# Patient Record
Sex: Female | Born: 1990 | Race: White | Hispanic: No | Marital: Single | Smoking: Never smoker
Health system: Southern US, Community
[De-identification: ages and names within clinical notes are randomized; demographics above are authoritative.]

## PROBLEM LIST (undated history)

## (undated) DIAGNOSIS — E611 Iron deficiency: Secondary | ICD-10-CM

## (undated) DIAGNOSIS — E039 Hypothyroidism, unspecified: Secondary | ICD-10-CM

## (undated) DIAGNOSIS — R000 Tachycardia, unspecified: Secondary | ICD-10-CM

## (undated) DIAGNOSIS — G901 Familial dysautonomia [Riley-Day]: Secondary | ICD-10-CM

## (undated) DIAGNOSIS — E079 Disorder of thyroid, unspecified: Secondary | ICD-10-CM

## (undated) DIAGNOSIS — E2740 Unspecified adrenocortical insufficiency: Secondary | ICD-10-CM

## (undated) DIAGNOSIS — F909 Attention-deficit hyperactivity disorder, unspecified type: Secondary | ICD-10-CM

## (undated) HISTORY — DX: Unspecified adrenocortical insufficiency: E27.40

## (undated) HISTORY — PX: OTHER SURGICAL HISTORY: SHX169

## (undated) HISTORY — PX: SPLENECTOMY: SUR1306

## (undated) HISTORY — PX: TONSILLECTOMY: SUR1361

---
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
## 2011-01-04 ENCOUNTER — Emergency Department: Payer: Self-pay | Admitting: Emergency Medicine

## 2011-01-20 ENCOUNTER — Emergency Department (HOSPITAL_COMMUNITY)
Admission: EM | Admit: 2011-01-20 | Discharge: 2011-01-21 | Disposition: A | Discharge status: Home / Self Care | Payer: Managed Care, Other (non HMO) | Attending: Emergency Medicine | Admitting: Emergency Medicine

## 2011-01-20 DIAGNOSIS — R220 Localized swelling, mass and lump, head: Secondary | ICD-10-CM | POA: Insufficient documentation

## 2011-01-20 DIAGNOSIS — R000 Tachycardia, unspecified: Secondary | ICD-10-CM | POA: Insufficient documentation

## 2011-01-20 DIAGNOSIS — R221 Localized swelling, mass and lump, neck: Secondary | ICD-10-CM | POA: Insufficient documentation

## 2011-01-20 DIAGNOSIS — F988 Other specified behavioral and emotional disorders with onset usually occurring in childhood and adolescence: Secondary | ICD-10-CM | POA: Insufficient documentation

## 2011-01-20 DIAGNOSIS — F411 Generalized anxiety disorder: Secondary | ICD-10-CM | POA: Insufficient documentation

## 2011-01-20 DIAGNOSIS — R4701 Aphasia: Secondary | ICD-10-CM | POA: Insufficient documentation

## 2011-01-20 DIAGNOSIS — I10 Essential (primary) hypertension: Secondary | ICD-10-CM | POA: Insufficient documentation

## 2011-01-20 DIAGNOSIS — T7840XA Allergy, unspecified, initial encounter: Secondary | ICD-10-CM | POA: Insufficient documentation

## 2011-01-20 DIAGNOSIS — L298 Other pruritus: Secondary | ICD-10-CM | POA: Insufficient documentation

## 2011-01-20 DIAGNOSIS — E039 Hypothyroidism, unspecified: Secondary | ICD-10-CM | POA: Insufficient documentation

## 2011-01-20 DIAGNOSIS — R061 Stridor: Secondary | ICD-10-CM | POA: Insufficient documentation

## 2011-01-20 DIAGNOSIS — J45909 Unspecified asthma, uncomplicated: Secondary | ICD-10-CM | POA: Insufficient documentation

## 2011-01-20 DIAGNOSIS — L2989 Other pruritus: Secondary | ICD-10-CM | POA: Insufficient documentation

## 2011-01-27 ENCOUNTER — Emergency Department: Payer: Self-pay | Admitting: Emergency Medicine

## 2011-02-06 ENCOUNTER — Emergency Department: Payer: Self-pay | Admitting: Emergency Medicine

## 2011-02-20 ENCOUNTER — Emergency Department (HOSPITAL_COMMUNITY)
Admission: EM | Admit: 2011-02-20 | Discharge: 2011-02-20 | Disposition: A | Discharge status: Home / Self Care | Payer: Managed Care, Other (non HMO) | Attending: Emergency Medicine | Admitting: Emergency Medicine

## 2011-02-20 DIAGNOSIS — E039 Hypothyroidism, unspecified: Secondary | ICD-10-CM | POA: Insufficient documentation

## 2011-02-20 DIAGNOSIS — F411 Generalized anxiety disorder: Secondary | ICD-10-CM | POA: Insufficient documentation

## 2011-02-20 DIAGNOSIS — X58XXXA Exposure to other specified factors, initial encounter: Secondary | ICD-10-CM | POA: Insufficient documentation

## 2011-02-20 DIAGNOSIS — T7840XA Allergy, unspecified, initial encounter: Secondary | ICD-10-CM | POA: Insufficient documentation

## 2011-02-20 DIAGNOSIS — Z794 Long term (current) use of insulin: Secondary | ICD-10-CM | POA: Insufficient documentation

## 2011-02-20 DIAGNOSIS — E109 Type 1 diabetes mellitus without complications: Secondary | ICD-10-CM | POA: Insufficient documentation

## 2011-03-06 ENCOUNTER — Emergency Department (HOSPITAL_COMMUNITY): Payer: Managed Care, Other (non HMO)

## 2011-03-06 ENCOUNTER — Emergency Department (HOSPITAL_COMMUNITY)
Admission: EM | Admit: 2011-03-06 | Discharge: 2011-03-07 | Disposition: A | Discharge status: Home / Self Care | Payer: Managed Care, Other (non HMO) | Attending: Emergency Medicine | Admitting: Emergency Medicine

## 2011-03-06 DIAGNOSIS — F411 Generalized anxiety disorder: Secondary | ICD-10-CM | POA: Insufficient documentation

## 2011-03-06 DIAGNOSIS — R0609 Other forms of dyspnea: Secondary | ICD-10-CM | POA: Insufficient documentation

## 2011-03-06 DIAGNOSIS — R0989 Other specified symptoms and signs involving the circulatory and respiratory systems: Secondary | ICD-10-CM | POA: Insufficient documentation

## 2011-03-06 LAB — POCT I-STAT, CHEM 8
Creatinine, Ser: 0.5 mg/dL (ref 0.50–1.10)
HCT: 41 % (ref 36.0–46.0)
Hemoglobin: 13.9 g/dL (ref 12.0–15.0)
Potassium: 4.5 mEq/L (ref 3.5–5.1)
Sodium: 138 mEq/L (ref 135–145)

## 2011-03-07 ENCOUNTER — Emergency Department (HOSPITAL_COMMUNITY)
Admit: 2011-03-07 | Discharge: 2011-03-07 | Disposition: A | Discharge status: Home / Self Care | Payer: Managed Care, Other (non HMO) | Attending: Emergency Medicine | Admitting: Emergency Medicine

## 2011-03-07 LAB — DIFFERENTIAL
Basophils Absolute: 0 10*3/uL (ref 0.0–0.1)
Lymphocytes Relative: 4 % — ABNORMAL LOW (ref 12–46)
Neutro Abs: 12.7 10*3/uL — ABNORMAL HIGH (ref 1.7–7.7)
Neutrophils Relative %: 96 % — ABNORMAL HIGH (ref 43–77)

## 2011-03-07 LAB — POCT I-STAT 3, ART BLOOD GAS (G3+)
O2 Saturation: 97 %
TCO2: 23 mmol/L (ref 0–100)
pCO2 arterial: 33.7 mmHg — ABNORMAL LOW (ref 35.0–45.0)
pH, Arterial: 7.42 — ABNORMAL HIGH (ref 7.350–7.400)
pO2, Arterial: 93 mmHg (ref 80.0–100.0)

## 2011-03-07 LAB — CBC
HCT: 37.6 % (ref 36.0–46.0)
Hemoglobin: 13.2 g/dL (ref 12.0–15.0)
WBC: 13.3 10*3/uL — ABNORMAL HIGH (ref 4.0–10.5)

## 2011-03-09 LAB — GLUCOSE, CAPILLARY: Glucose-Capillary: 151 mg/dL — ABNORMAL HIGH (ref 70–99)

## 2011-05-26 ENCOUNTER — Emergency Department: Payer: Self-pay | Admitting: Emergency Medicine

## 2011-07-22 ENCOUNTER — Emergency Department: Payer: Self-pay | Admitting: *Deleted

## 2011-07-28 ENCOUNTER — Emergency Department (HOSPITAL_COMMUNITY)
Admission: EM | Admit: 2011-07-28 | Discharge: 2011-07-28 | Disposition: A | Discharge status: Home / Self Care | Payer: Managed Care, Other (non HMO) | Attending: Emergency Medicine | Admitting: Emergency Medicine

## 2011-07-28 ENCOUNTER — Encounter: Payer: Self-pay | Admitting: *Deleted

## 2011-07-28 DIAGNOSIS — Z79899 Other long term (current) drug therapy: Secondary | ICD-10-CM | POA: Insufficient documentation

## 2011-07-28 DIAGNOSIS — E079 Disorder of thyroid, unspecified: Secondary | ICD-10-CM | POA: Insufficient documentation

## 2011-07-28 DIAGNOSIS — J45909 Unspecified asthma, uncomplicated: Secondary | ICD-10-CM | POA: Insufficient documentation

## 2011-07-28 DIAGNOSIS — R1110 Vomiting, unspecified: Secondary | ICD-10-CM | POA: Insufficient documentation

## 2011-07-28 DIAGNOSIS — E1169 Type 2 diabetes mellitus with other specified complication: Secondary | ICD-10-CM | POA: Insufficient documentation

## 2011-07-28 DIAGNOSIS — E162 Hypoglycemia, unspecified: Secondary | ICD-10-CM

## 2011-07-28 DIAGNOSIS — F909 Attention-deficit hyperactivity disorder, unspecified type: Secondary | ICD-10-CM | POA: Insufficient documentation

## 2011-07-28 HISTORY — DX: Attention-deficit hyperactivity disorder, unspecified type: F90.9

## 2011-07-28 HISTORY — DX: Disorder of thyroid, unspecified: E07.9

## 2011-07-28 LAB — GLUCOSE, CAPILLARY
Glucose-Capillary: 159 mg/dL — ABNORMAL HIGH (ref 70–99)
Glucose-Capillary: 53 mg/dL — ABNORMAL LOW (ref 70–99)

## 2011-07-28 MED ORDER — ONDANSETRON HCL 4 MG PO TABS: 4.0000 mg | ORAL_TABLET | Freq: Four times a day (QID) | ORAL | Status: AC

## 2011-07-28 MED ORDER — GLUCOSE 40 % PO GEL
ORAL | Status: AC
  Filled 2011-07-28: qty 1

## 2011-07-28 MED ORDER — DEXTROSE 50 % IV SOLN
INTRAVENOUS | Status: AC
  Administered 2011-07-28: 25 g via INTRAVENOUS
  Filled 2011-07-28: qty 50

## 2011-07-28 MED ORDER — PREDNISONE 20 MG PO TABS
20.0000 mg | ORAL_TABLET | Freq: Once | ORAL | Status: AC
  Administered 2011-07-28: 20 mg via ORAL
  Filled 2011-07-28: qty 1

## 2011-07-28 MED ORDER — ONDANSETRON HCL 4 MG/2ML IJ SOLN
4.0000 mg | Freq: Once | INTRAMUSCULAR | Status: AC
  Administered 2011-07-28: 4 mg via INTRAVENOUS
  Filled 2011-07-28: qty 2

## 2011-07-28 NOTE — ED Notes (Signed)
BBG 53 after oral glucose

## 2011-07-28 NOTE — ED Provider Notes (Signed)
History     CSN: [id number] Arrival date & time: 07/28/2011 11:06 AM   First MD Initiated Contact with Patient 07/28/11 1152      Chief Complaint  Patient presents with  . 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 She has been insulin-dependent for a few months. She states today about 9 AM she took her prednisone 20 mg and also took her NPH insulin however she immediately vomited the pill. She states about 30 minutes prior to arrival she checked her blood sugar was 32 and she took a dose of glucagon and came to the ER. She states that she's been here she's eaten a [place] sandwich. She relates her nausea is better and she would like to try taking her prednisone again.   Primary care physician at [facility]   Past Medical History  Diagnosis Date  . Diabetes mellitus   . Asthma   . Thyroid disease   . ADHD (attention deficit hyperactivity disorder)     History reviewed. No pertinent past surgical history.  History reviewed. No pertinent family history.  History  Substance Use Topics  . Smoking status: Never Smoker   . Smokeless tobacco: Not on file  . Alcohol Use: Yes   college student  OB History    Grav Para Term Preterm Abortions TAB SAB Ect Mult Living                  Review of Systems  All other systems reviewed and are negative.    Allergies  Advil; Amoxicillin; Azithromycin; Barium-containing compounds; Bee; Fish allergy; Gelatin; Iodine; Latex; Oxycodone; Peanut-containing drug products; and Penicillins  Home Medications   Current Outpatient Rx  Name Route Sig Dispense Refill  . DIPHENHYDRAMINE HCL 50 MG PO CAPS Oral Take 50 mg by mouth 2 (two) times daily.      Marland Kitchen FAMOTIDINE 20 MG PO TABS Oral Take 20 mg by mouth 2 (two) times daily.      Marland Kitchen FEXOFENADINE  HCL 30 MG PO TABS Oral Take 30 mg by mouth 2 (two) times daily.      Marland Kitchen FLUTICASONE-SALMETEROL 250-50 MCG/DOSE IN AEPB Inhalation Inhale 1 puff into the lungs every 12 (twelve) hours.      Marland Kitchen GLUCAGON (RDNA) 1 MG IJ KIT Intravenous Inject 1 mg into the vein once as needed. Low blood sugar     . HYDROCORTISONE SOD SUCCINATE 100 MG IJ SOLR Intravenous Inject 100 mg into the vein as needed. For adrenal crisis      . INSULIN LISPRO (HUMAN) 100 UNIT/ML [place] SOLN Subcutaneous Inject 1-10 Units into the skin 3 (three) times daily before meals. Sliding scale     . INSULIN ISOPHANE HUMAN 100 UNIT/ML [place] SUSP Subcutaneous Inject 15 Units into the skin 2 (two) times daily.      Marland Kitchen LEVOTHYROXINE SODIUM 125 MCG PO TABS Oral Take 125 mcg by mouth daily.      Marland Kitchen LORATADINE 10 MG PO TABS Oral Take 10 mg by mouth daily.      . METHYLPHENIDATE 30 MG/9HR TD PTCH Transdermal Place 1 patch onto the skin daily. wear patch for 9 hours only each day     . METHYLPHENIDATE HCL 10 MG PO TABS Oral Take 10 mg by mouth every evening.      Marland Kitchen MONTELUKAST SODIUM 10 MG PO TABS Oral  Take 10 mg by mouth at bedtime.      . OLOPATADINE HCL 0.1 % OP SOLN Both Eyes Place 1 drop into both eyes 2 (two) times daily.      Marland Kitchen PIRBUTEROL ACETATE 200 MCG/INH IN AERB Inhalation Inhale 2 puffs into the lungs 4 (four) times daily as needed. Uses when she horseback rides.     Marland Kitchen PREDNISONE 20 MG PO TABS Oral Take 20 mg by mouth 2 (two) times daily.      Marland Kitchen EPINEPHRINE 0.15 MG/0.3ML IJ DEVI Intramuscular Inject 0.15 mg into the muscle as needed.      Marland Kitchen ONDANSETRON HCL 4 MG PO TABS Oral Take 1 tablet (4 mg total) by mouth every 6 (six) hours. 6 tablet 0    BP 122/91  Pulse 116  Temp(Src) 98.5 F (36.9 C) (Oral)  Resp 18  SpO2 98%  No signs tachycardia otherwise normal  Physical Exam  Nursing note and vitals reviewed. Constitutional: She is oriented to person, place, and time. She appears well-developed and well-nourished.  Non-toxic appearance. She  does not appear ill. No distress.       Cooperative smiling  HENT:  Head: Normocephalic and atraumatic.  Right Ear: External ear normal.  Left Ear: External ear normal.  Nose: Nose normal. No mucosal edema or rhinorrhea.  Mouth/Throat: Oropharynx is clear and moist and mucous membranes are normal. No dental abscesses or uvula swelling.  Eyes: Conjunctivae and EOM are normal. Pupils are equal, round, and reactive to light.  Neck: Normal range of motion and full passive range of motion without pain. Neck supple.  Cardiovascular: Normal rate, regular rhythm and normal heart sounds.  Exam reveals no gallop and no friction rub.   No murmur heard. Pulmonary/Chest: Effort normal and breath sounds normal. No respiratory distress. She has no wheezes. She has no rhonchi. She has no rales. She exhibits no tenderness and no crepitus.  Abdominal: Soft. Normal appearance and bowel sounds are normal. She exhibits no distension. There is no tenderness. There is no rebound and no guarding.  Musculoskeletal: Normal range of motion. She exhibits no edema and no tenderness.       Moves all extremities well.   Neurological: She is alert and oriented to person, place, and time. She has normal strength. No cranial nerve deficit.  Skin: Skin is warm, dry and intact. No rash noted. No erythema. No pallor.  Psychiatric: She has a normal mood and affect. Her speech is normal and behavior is normal. Her mood appears not anxious.    ED Course  Procedures (including critical care time)  Patient given Zofran or milligrams IV and prednisone 20 mg orally. Recheck at 1:30 shows patient alert and active talking to her friend. She states she's rate ago. We will check one more blood sugar before she leaves. Pt wants to leave, states she will eat when she leaves.   Results for orders placed during the hospital encounter of 07/28/11  GLUCOSE, CAPILLARY      Component Value Range   Glucose-Capillary 39 (*) 70 - 99 (mg/dL)    Comment 1 Notify RN    GLUCOSE, CAPILLARY      Component Value Range   Glucose-Capillary 53 (*) 70 - 99 (mg/dL)  GLUCOSE, CAPILLARY      Component Value Range   Glucose-Capillary 159 (*) 70 - 99 (mg/dL)  GLUCOSE, CAPILLARY      Component Value Range   Glucose-Capillary 96  70 - 99 (mg/dL)    Diagnoses  that have been ruled out:  Diagnoses that are still under consideration:  Final diagnoses:  Hypoglycemia  Vomiting    Plan discharge  Devoria Albe, MD, FACEP     MDM          Ward Givens, MD 07/28/11 [phone]

## 2011-07-28 NOTE — ED Notes (Signed)
BBG 159 after dextrose 50% 1 amp @ 1226

## 2011-07-28 NOTE — ED Notes (Signed)
Pt reports low blood sugar this am, states type one diabetic. States sugar was 33. Took glucagon, repeated sugar and was wnl. Pt reports she is afraid it will go back down. Takes prednisone daily.

## 2011-07-28 NOTE — ED Notes (Signed)
Patient given [place] sandwich, cheese and milk per orders from Ebbie Ridge, PA-C

## 2011-08-31 ENCOUNTER — Encounter (HOSPITAL_COMMUNITY): Payer: Self-pay | Admitting: *Deleted

## 2011-08-31 ENCOUNTER — Emergency Department (HOSPITAL_COMMUNITY)
Admission: EM | Admit: 2011-08-31 | Discharge: 2011-09-01 | Discharge status: Against Medical Advice | Payer: Managed Care, Other (non HMO) | Attending: Emergency Medicine | Admitting: Emergency Medicine

## 2011-08-31 DIAGNOSIS — F411 Generalized anxiety disorder: Secondary | ICD-10-CM | POA: Insufficient documentation

## 2011-08-31 DIAGNOSIS — E162 Hypoglycemia, unspecified: Secondary | ICD-10-CM

## 2011-08-31 DIAGNOSIS — E1069 Type 1 diabetes mellitus with other specified complication: Secondary | ICD-10-CM | POA: Insufficient documentation

## 2011-08-31 DIAGNOSIS — R000 Tachycardia, unspecified: Secondary | ICD-10-CM | POA: Insufficient documentation

## 2011-08-31 DIAGNOSIS — Z79899 Other long term (current) drug therapy: Secondary | ICD-10-CM | POA: Insufficient documentation

## 2011-08-31 DIAGNOSIS — Z794 Long term (current) use of insulin: Secondary | ICD-10-CM | POA: Insufficient documentation

## 2011-08-31 DIAGNOSIS — J45909 Unspecified asthma, uncomplicated: Secondary | ICD-10-CM | POA: Insufficient documentation

## 2011-08-31 DIAGNOSIS — F909 Attention-deficit hyperactivity disorder, unspecified type: Secondary | ICD-10-CM | POA: Insufficient documentation

## 2011-08-31 DIAGNOSIS — R259 Unspecified abnormal involuntary movements: Secondary | ICD-10-CM | POA: Insufficient documentation

## 2011-08-31 LAB — GLUCOSE, CAPILLARY: Glucose-Capillary: 117 mg/dL — ABNORMAL HIGH (ref 70–99)

## 2011-08-31 MED ORDER — GLUCOSE 40 % PO GEL
1.0000 | Freq: Once | ORAL | Status: AC
  Administered 2011-08-31: 37.5 g via ORAL
  Filled 2011-08-31: qty 1

## 2011-08-31 NOTE — ED Notes (Signed)
PT reported  Receiving extra insulin at a new MD. Pt reports CBG of 34  At home . Pt was instructed  By PCP to take 150 mg solucortef and glucagon 2mg .

## 2011-08-31 NOTE — ED Provider Notes (Signed)
History     CSN: [id number]  Arrival date & time 08/31/11  2015   First MD Initiated Contact with Patient 08/31/11 2202      Chief Complaint  Patient presents with  . Hypoglycemia     HPI  History provided by the patient. Patient is a 21 year old female with history of type 1 diabetes presents with complaints of hypoglycemia earlier today. Patient states that she is seeing a new adult endocrinologist who prescribed her 50 units of Lantus to take. She reports having her first dose earlier today and about one hour prior to arrival she began feeling shaky and having symptoms of low blood sugar. When she checked her sugar she states that was 34. She took tabs of glucagon and 150 mg of Solucortef. Patient states that since that time she is starting to feel much better and was able to drive her to the emergency room. Patient continues to feel well at this time. She has no complaints but states that she is frustrated her new doctor put her on such a high dose of Lantus. Patient states that she is very sensitive to insulin as and she knew prior to this that was likely going to be too much. Patient has no other symptoms.     Past Medical History  Diagnosis Date  . Diabetes mellitus   . Asthma   . Thyroid disease   . ADHD (attention deficit hyperactivity disorder)     No past surgical history on file.  No family history on file.  History  Substance Use Topics  . Smoking status: Never Smoker   . Smokeless tobacco: Not on file  . Alcohol Use: Yes    OB History    Grav Para Term Preterm Abortions TAB SAB Ect Mult Living                  Review of Systems  All other systems reviewed and are negative.    Allergies  Advil; Amoxicillin; Azithromycin; Barium-containing compounds; Bee; Fish allergy; Gelatin; Iodine; Latex; Other; Oxycodone; Peanut-containing drug products; Penicillins; and Shellfish allergy  Home Medications   Current Outpatient Rx  Name Route Sig Dispense Refill   . DIPHENHYDRAMINE HCL 50 MG PO CAPS Oral Take 50 mg by mouth 2 (two) times daily.      Marland Kitchen EPINEPHRINE 0.15 MG/0.3ML IJ DEVI Intramuscular Inject 0.15 mg into the muscle as needed.      Marland Kitchen FAMOTIDINE 20 MG PO TABS Oral Take 20 mg by mouth 2 (two) times daily.      Marland Kitchen FEXOFENADINE HCL 30 MG PO TABS Oral Take 30 mg by mouth 2 (two) times daily.      Marland Kitchen FLUTICASONE-SALMETEROL 250-50 MCG/DOSE IN AEPB Inhalation Inhale 1 puff into the lungs every 12 (twelve) hours.      Marland Kitchen GLUCAGON (RDNA) 1 MG IJ KIT Intravenous Inject 1 mg into the vein once as needed. Low blood sugar     . HYDROCORTISONE SOD SUCCINATE 100 MG IJ SOLR Intravenous Inject 100 mg into the vein as needed. For adrenal crisis      . INSULIN LISPRO (HUMAN) 100 UNIT/ML [place] SOLN Subcutaneous Inject 1-10 Units into the skin 3 (three) times daily before meals. Sliding scale     . INSULIN ISOPHANE HUMAN 100 UNIT/ML [place] SUSP Subcutaneous Inject 15 Units into the skin 2 (two) times daily.      Marland Kitchen LEVOTHYROXINE SODIUM 125 MCG PO TABS Oral Take 125 mcg by mouth daily.      Marland Kitchen  LORATADINE 10 MG PO TABS Oral Take 10 mg by mouth daily.      . METHYLPHENIDATE 30 MG/9HR TD PTCH Transdermal Place 1 patch onto the skin daily. wear patch for 9 hours only each day     . METHYLPHENIDATE HCL 10 MG PO TABS Oral Take 10 mg by mouth every evening.      Marland Kitchen MONTELUKAST SODIUM 10 MG PO TABS Oral Take 10 mg by mouth at bedtime.      . OLOPATADINE HCL 0.1 % OP SOLN Both Eyes Place 1 drop into both eyes 2 (two) times daily.      Marland Kitchen PIRBUTEROL ACETATE 200 MCG/INH IN AERB Inhalation Inhale 2 puffs into the lungs 4 (four) times daily as needed. Uses when she horseback rides.     Marland Kitchen PREDNISONE 20 MG PO TABS Oral Take 20 mg by mouth 2 (two) times daily.        BP 119/79  Pulse 105  Temp(Src) 98.5 F (36.9 C) (Oral)  Resp 20  SpO2 99%  Physical Exam  Nursing note and vitals reviewed. Constitutional: She is oriented to person, place, and time. She appears well-developed and  well-nourished. No distress.  HENT:  Head: Normocephalic and atraumatic.  Eyes: Conjunctivae and EOM are normal. Pupils are equal, round, and reactive to light.  Cardiovascular: Regular rhythm.  Tachycardia present.   Pulmonary/Chest: Effort normal and breath sounds normal.  Abdominal: Soft.  Neurological: She is alert and oriented to person, place, and time. She has normal strength. No sensory deficit. Gait normal.  Skin: Skin is warm and dry. No rash noted.  Psychiatric: Her behavior is normal. Her mood appears anxious.    ED Course  Procedures   Results for orders placed during the hospital encounter of 08/31/11  GLUCOSE, CAPILLARY      Component Value Range   Glucose-Capillary 117 (*) 70 - 99 (mg/dL)   Comment 1 Notify RN    GLUCOSE, CAPILLARY      Component Value Range   Glucose-Capillary 104 (*) 70 - 99 (mg/dL)   Comment 1 Notify RN    GLUCOSE, CAPILLARY      Component Value Range   Glucose-Capillary 77  70 - 99 (mg/dL)   Comment 1 Notify RN    GLUCOSE, CAPILLARY      Component Value Range   Glucose-Capillary 93  70 - 99 (mg/dL)   Comment 1 Notify RN    GLUCOSE, CAPILLARY      Component Value Range   Glucose-Capillary 55 (*) 70 - 99 (mg/dL)  GLUCOSE, CAPILLARY      Component Value Range   Glucose-Capillary 148 (*) 70 - 99 (mg/dL)   Comment 1 Notify RN    GLUCOSE, CAPILLARY      Component Value Range   Glucose-Capillary 98  70 - 99 (mg/dL)   Comment 1 Documented in Chart     Comment 2 Notify RN    GLUCOSE, CAPILLARY      Component Value Range   Glucose-Capillary 65 (*) 70 - 99 (mg/dL)   Comment 1 Notify RN     Comment 2 Documented in Chart    GLUCOSE, CAPILLARY      Component Value Range   Glucose-Capillary 171 (*) 70 - 99 (mg/dL)       1. Hypoglycemia       MDM  10:10 PM patient seen and evaluated. Patient in no acute distress. Patient here for hypoglycemia after taking Lantus. Plan to check serial CBCs over the next  hour to trend CBG. Patient  will be kept n.p.o. except for water.   She continues to have dropping blood sugar despite intravenous D5 and patient eating.  Patient discussed with attending physician. Plan to keep patient until blood sugar stabilizes any other trends up slightly or maintains a good level.   Patient now wishing to return home. She states she no longer wants to stay in the emergency room. I explained the risks of her sugar dropping low if she returns home. She states that she understands and will check her sugar closely and continue to eat food and candy. Patient understands she will be leaving AMA. She was advised she may return at any time and the seriousness of her condition.     Angus Seller, [place] 09/01/11 [phone]

## 2011-09-01 LAB — GLUCOSE, CAPILLARY
Glucose-Capillary: 55 mg/dL — ABNORMAL LOW (ref 70–99)
Glucose-Capillary: 148 mg/dL — ABNORMAL HIGH (ref 70–99)

## 2011-09-01 MED ORDER — ONDANSETRON 4 MG PO TBDP
8.0000 mg | ORAL_TABLET | Freq: Once | ORAL | Status: AC
  Administered 2011-09-01: 8 mg via ORAL

## 2011-09-01 MED ORDER — ONDANSETRON 4 MG PO TBDP
ORAL_TABLET | ORAL | Status: AC
  Administered 2011-09-01: 01:00:00
  Filled 2011-09-01: qty 2

## 2011-09-01 MED ORDER — DEXTROSE 50 % IV SOLN
25.0000 g | Freq: Once | INTRAVENOUS | Status: AC
  Administered 2011-09-01: 25 g via INTRAVENOUS
  Filled 2011-09-01: qty 50

## 2011-09-01 NOTE — ED Notes (Signed)
The patient states that she has a dull, 2/10 headache and mild stomach discomfort as a side effect of her hypoglycemia meds.  She states that she feels fine other than the above listed issues.

## 2011-09-01 NOTE — ED Notes (Signed)
Verbal order received from Dr.Rancor to give the patient a meal tray for her low blood sugar.

## 2011-09-01 NOTE — ED Notes (Signed)
CBG 98  

## 2011-09-01 NOTE — ED Notes (Signed)
Patient's blood sugar is 55.

## 2011-09-01 NOTE — ED Notes (Signed)
Patient is resting comfortably. 

## 2011-09-01 NOTE — ED Notes (Signed)
CBG: 93. RN notified 

## 2011-09-01 NOTE — ED Notes (Signed)
CBG 148. RN notified.

## 2011-09-02 NOTE — ED Provider Notes (Signed)
Medical screening examination/treatment/procedure(s) were performed by non-physician practitioner and as supervising physician I was immediately available for consultation/collaboration.    Suzi Roots, MD 09/02/11 [phone]

## 2011-10-13 ENCOUNTER — Emergency Department: Payer: Self-pay | Admitting: Emergency Medicine

## 2011-10-14 LAB — COMPREHENSIVE METABOLIC PANEL
Albumin: 4.2 g/dL (ref 3.4–5.0)
Alkaline Phosphatase: 86 U/L (ref 50–136)
Anion Gap: 14 (ref 7–16)
Bilirubin,Total: 0.4 mg/dL (ref 0.2–1.0)
Calcium, Total: 9.9 mg/dL (ref 8.5–10.1)
Co2: 26 mmol/L (ref 21–32)
Creatinine: 0.85 mg/dL (ref 0.60–1.30)
EGFR (African American): >60
EGFR (Non-African Amer.): >60
Osmolality: 283 (ref 275–301)
Potassium: 3.1 mmol/L — ABNORMAL LOW (ref 3.5–5.1)
SGOT(AST): 26 U/L (ref 15–37)
Sodium: 143 mmol/L (ref 136–145)
Total Protein: 9 g/dL — ABNORMAL HIGH (ref 6.4–8.2)

## 2011-10-14 LAB — CBC WITH DIFFERENTIAL/PLATELET
Basophil #: 0 10*3/uL (ref 0.0–0.1)
Eosinophil %: 1.3 %
HCT: 37.8 % (ref 35.0–47.0)
HGB: 12.8 g/dL (ref 12.0–16.0)
Lymphocyte #: 3.3 10*3/uL (ref 1.0–3.6)
Lymphocyte %: 25 %
MCH: 26.9 pg (ref 26.0–34.0)
MCV: 80 fL (ref 80–100)
Monocyte #: 0.7 10*3/uL (ref 0.0–0.7)
Monocyte %: 5.5 %
Neutrophil %: 67.9 %
Platelet: 395 10*3/uL (ref 150–440)
RBC: 4.75 10*6/uL (ref 3.80–5.20)
WBC: 13.3 10*3/uL — ABNORMAL HIGH (ref 3.6–11.0)

## 2011-10-14 LAB — URINALYSIS, COMPLETE
Glucose,UR: NEGATIVE mg/dL (ref 0–75)
Leukocyte Esterase: NEGATIVE
Nitrite: NEGATIVE
Ph: 6 (ref 4.5–8.0)
RBC,UR: 1 /HPF (ref 0–5)
Specific Gravity: 1.004 (ref 1.003–1.030)
Squamous Epithelial: 1
WBC UR: NONE SEEN /HPF (ref 0–5)

## 2011-10-26 ENCOUNTER — Emergency Department: Payer: Self-pay | Admitting: *Deleted

## 2011-11-01 ENCOUNTER — Encounter (HOSPITAL_COMMUNITY): Payer: Self-pay | Admitting: Emergency Medicine

## 2011-11-01 ENCOUNTER — Emergency Department (HOSPITAL_COMMUNITY)
Admission: EM | Admit: 2011-11-01 | Discharge: 2011-11-01 | Disposition: A | Discharge status: Home Health | Payer: Managed Care, Other (non HMO) | Attending: Emergency Medicine | Admitting: Emergency Medicine

## 2011-11-01 ENCOUNTER — Emergency Department (HOSPITAL_COMMUNITY): Payer: Managed Care, Other (non HMO)

## 2011-11-01 DIAGNOSIS — E119 Type 2 diabetes mellitus without complications: Secondary | ICD-10-CM | POA: Insufficient documentation

## 2011-11-01 DIAGNOSIS — E039 Hypothyroidism, unspecified: Secondary | ICD-10-CM | POA: Insufficient documentation

## 2011-11-01 DIAGNOSIS — W108XXA Fall (on) (from) other stairs and steps, initial encounter: Secondary | ICD-10-CM | POA: Insufficient documentation

## 2011-11-01 DIAGNOSIS — F909 Attention-deficit hyperactivity disorder, unspecified type: Secondary | ICD-10-CM | POA: Insufficient documentation

## 2011-11-01 DIAGNOSIS — M25569 Pain in unspecified knee: Secondary | ICD-10-CM | POA: Insufficient documentation

## 2011-11-01 DIAGNOSIS — IMO0002 Reserved for concepts with insufficient information to code with codable children: Secondary | ICD-10-CM | POA: Insufficient documentation

## 2011-11-01 DIAGNOSIS — Z794 Long term (current) use of insulin: Secondary | ICD-10-CM | POA: Insufficient documentation

## 2011-11-01 DIAGNOSIS — M25473 Effusion, unspecified ankle: Secondary | ICD-10-CM | POA: Insufficient documentation

## 2011-11-01 DIAGNOSIS — Z79899 Other long term (current) drug therapy: Secondary | ICD-10-CM | POA: Insufficient documentation

## 2011-11-01 DIAGNOSIS — M25579 Pain in unspecified ankle and joints of unspecified foot: Secondary | ICD-10-CM | POA: Insufficient documentation

## 2011-11-01 DIAGNOSIS — M25476 Effusion, unspecified foot: Secondary | ICD-10-CM | POA: Insufficient documentation

## 2011-11-01 DIAGNOSIS — S93409A Sprain of unspecified ligament of unspecified ankle, initial encounter: Secondary | ICD-10-CM | POA: Insufficient documentation

## 2011-11-01 HISTORY — DX: Hypothyroidism, unspecified: E03.9

## 2011-11-01 NOTE — ED Provider Notes (Signed)
History     CSN: [id number]  Arrival date & time 11/01/11  2132   First MD Initiated Contact with Patient 11/01/11 2142      Chief Complaint  Patient presents with  . Ankle Pain    (Consider location/radiation/quality/duration/timing/severity/associated sxs/prior treatment) HPI Comments: Patient was walking down the stairs with high heels on and tripped. She fell onto her left ankle and onto her right knee. Patient sustained abrasion to right knee. Patient is ambulatory with pain in her left ankle. She also notes swelling at left ankle. She has not treated with any medicines prior to arrival. She denies head or neck injury.  Patient is a 21 y.o. female presenting with ankle pain. The history is provided by the patient.  Ankle Pain  The incident occurred 3 to 5 hours ago. The injury mechanism was torsion. The pain is present in the left knee and right ankle. The quality of the pain is described as aching. The pain is moderate. Pertinent negatives include no numbness, no inability to bear weight, no loss of sensation and no tingling. The symptoms are aggravated by bearing weight. She has tried nothing for the symptoms.    Past Medical History  Diagnosis Date  . Diabetes mellitus   . Asthma   . Thyroid disease   . ADHD (attention deficit hyperactivity disorder)   . Hypothyroidism     Past Surgical History  Procedure Date  . Tonsillectomy   . Adnoid   . Splenectomy     No family history on file.  History  Substance Use Topics  . Smoking status: Never Smoker   . Smokeless tobacco: Not on file  . Alcohol Use: Yes    OB History    Grav Para Term Preterm Abortions TAB SAB Ect Mult Living                  Review of Systems  Constitutional: Negative for activity change.  HENT: Negative for neck pain.   Musculoskeletal: Positive for arthralgias. Negative for back pain and joint swelling.  Skin: Positive for wound.  Neurological: Negative for tingling, weakness and  numbness.    Allergies  Advil; Amoxicillin; Azithromycin; Barium-containing compounds; Bee; Fish allergy; Gelatin; Iodine; Latex; Other; Oxycodone; Peanut-containing drug products; Penicillins; and Shellfish allergy  Home Medications   Current Outpatient Rx  Name Route Sig Dispense Refill  . ALBUTEROL SULFATE HFA 108 (90 BASE) MCG/ACT IN AERS Inhalation Inhale 2 puffs into the lungs every 6 (six) hours as needed. For wheezing    . DIPHENHYDRAMINE HCL 50 MG PO CAPS Oral Take 50 mg by mouth 2 (two) times daily.      Marland Kitchen EPINEPHRINE 0.15 MG/0.3ML IJ DEVI Intramuscular Inject 0.15 mg into the muscle as needed.      Marland Kitchen FAMOTIDINE 20 MG PO TABS Oral Take 20 mg by mouth 2 (two) times daily.      Marland Kitchen FEXOFENADINE HCL 30 MG PO TABS Oral Take 30 mg by mouth 2 (two) times daily.      Marland Kitchen FLUTICASONE-SALMETEROL 250-50 MCG/DOSE IN AEPB Inhalation Inhale 1 puff into the lungs every 12 (twelve) hours.      Marland Kitchen GLUCAGON (RDNA) 1 MG IJ KIT Intravenous Inject 1 mg into the vein once as needed. Low blood sugar     . HYDROCORTISONE SOD SUCCINATE 100 MG IJ SOLR Intravenous Inject 100 mg into the vein as needed. For adrenal crisis      . INSULIN GLARGINE 100 UNIT/ML [place] SOLN Subcutaneous Inject 15  Units into the skin daily.    . INSULIN LISPRO (HUMAN) 100 UNIT/ML [place] SOLN Subcutaneous Inject 1-15 Units into the skin 3 (three) times daily before meals. Sliding scale    . INSULIN ISOPHANE HUMAN 100 UNIT/ML [place] SUSP Subcutaneous Inject 10 Units into the skin 2 (two) times daily.     Marland Kitchen LEVOTHYROXINE SODIUM 150 MCG PO TABS Oral Take 150 mcg by mouth daily.    Marland Kitchen LORATADINE 10 MG PO TABS Oral Take 10 mg by mouth daily.      . METHYLPHENIDATE HCL 10 MG PO TABS Oral Take 10 mg by mouth daily as needed. If patch wears off....for ADD    . MONTELUKAST SODIUM 10 MG PO TABS Oral Take 10 mg by mouth at bedtime.      . OLOPATADINE HCL 0.1 % OP SOLN Both Eyes Place 1 drop into both eyes 2 (two) times daily.      Marland Kitchen PREDNISONE 20 MG PO TABS  Oral Take 60 mg by mouth 2 (two) times daily.    . METHYLPHENIDATE 30 MG/9HR TD PTCH Transdermal Place 1 patch onto the skin daily. wear patch for 9 hours only each day       BP 108/90  Pulse 146  Temp(Src) 97.7 F (36.5 C) (Oral)  Resp 20  SpO2 99%  Physical Exam  Nursing note and vitals reviewed. Constitutional: She is oriented to person, place, and time. She appears well-developed and well-nourished.  HENT:  Head: Normocephalic and atraumatic.  Eyes: Pupils are equal, round, and reactive to light.  Neck: Normal range of motion. Neck supple.  Cardiovascular: Exam reveals no decreased pulses.   Pulses:      Dorsalis pedis pulses are 2+ on the right side, and 2+ on the left side.       Posterior tibial pulses are 2+ on the right side, and 2+ on the left side.  Musculoskeletal: She exhibits tenderness. She exhibits no edema.       Right knee: Normal.       Left knee: She exhibits normal range of motion, no swelling and no effusion. no tenderness found.       Right ankle: She exhibits decreased range of motion and swelling. tenderness. Lateral malleolus and medial malleolus tenderness found. No head of 5th metatarsal and no proximal fibula tenderness found. Achilles tendon normal.       Left ankle: Normal.       Legs:      Antalgic gait  Neurological: She is alert and oriented to person, place, and time. No sensory deficit.       Motor, sensation, and vascular distal to the injury is fully intact.   Skin: Skin is warm and dry.  Psychiatric: She has a normal mood and affect.    ED Course  Procedures (including critical care time)  Labs Reviewed - No data to display Dg Ankle Complete Right  11/01/2011  *RADIOLOGY REPORT*  Clinical Data: Fall  RIGHT ANKLE - COMPLETE 3+ VIEW  Comparison: None.  Findings: No acute fracture and no dislocation.  Unremarkable soft tissues.  IMPRESSION: No acute bony pathology.  Original Report Authenticated By: Donavan Burnet, M.D.     1. Ankle  sprain     9:56 PM Patient seen and examined. Ice pack given. X-ray pending.  Vital signs reviewed and are as follows: Filed Vitals:   11/01/11 2135  BP: 108/90  Pulse: 146  Temp: 97.7 F (36.5 C)  Resp: 20   Patient refuses  pain medication.  10:46 PM patient informed of x-ray results. Urged followup with orthopedic doctor if no improvement in one week. Crutches and ASO by orthopedic tech.   MDM  Patient with ankle sprain, x-ray negative. Supportive treatment is indicated with orthopedic followup if no improvement in one week.        Renne Crigler, [place] 11/01/11 [phone]

## 2011-11-01 NOTE — Discharge Instructions (Signed)
Please read and follow all provided instructions.  Your diagnoses today include:  1. Ankle sprain     Tests performed today include:  An x-ray of your ankle - does NOT show any broken bones  Vital signs. See below for your results today.   Medications prescribed:   None   Home care instructions:   Follow any educational materials contained in this packet  Use crutches and ankle supporter as needed for comfort  Follow R.I.C.E. Protocol:  R - rest your injury   I  - use ice on injury without applying directly to skin  C - compress injury with bandage or splint  E - elevate the injury as much as possible  Follow-up instructions: Please follow-up with your primary care provider or the provided orthopedic (bone specialist) if you continue to have significant pain or trouble walking in 1 week. In this case you may have a severe sprain that requires further care.   If you do not have a primary care doctor -- see below for referral information.   Return instructions:   Please return if your toes are numb or tingling, appear gray or blue, or you have severe pain (also elevate leg and loosen splint or wrap)  Please return to the Emergency Department if you experience worsening symptoms.   Please return if you have any other emergent concerns.  Additional Information:  Your vital signs today were: BP 108/90  Pulse 146  Temp(Src) 97.7 F (36.5 C) (Oral)  Resp 20  SpO2 99% If your blood pressure (BP) was elevated above 135/85 this visit, please have this repeated by your doctor within one month. -------------- Your caregiver has diagnosed you as suffering from an ankle sprain. Ankle sprain occurs when the ligaments that hold the ankle joint together are stretched or torn. It may take 4 to 6 weeks to heal.  For Activity: If prescribed crutches, use crutches with non-weight bearing for the first few days. Then, you may walk on your ankle as the pain allows, or as  instructed. Start gradually with weight bearing on the affected ankle. Once you can walk pain free, then try jogging. When you can run forwards, then you can try moving side-to-side. If you cannot walk without crutches in one week, you need a re-check. -------------- No Primary Care Doctor Call [facility]  [phone] Other agencies that provide inexpensive medical care    [facility]  [phone]    [facility]  [phone]    [facility]  [phone]    [facility]  [phone]    [facility]  [phone]    [facility]  [phone] -------------- RESOURCE GUIDE:  Dental Problems  Patients with Medicaid: [facility] [phone] [address]                                            [phone] [address] [employer] Phone:  [phone]                                                   Phone:  [phone]  If unable to pay or uninsured, contact:  [facility] or [facility]. to become qualified for the adult dental clinic.  Chronic Pain Problems Contact [facility]  [phone] Patients need to be referred by their primary care doctor.  Insufficient Money for Medicine Contact [employer]:  call "211" or [facility] 7046917632.  Psychological Services [facility]  [phone] [facility]  [phone] [facility]   [phone] (emergency services [phone])  Substance Abuse Resources [facility]  [phone] [facility] [phone] [facility] [phone] Floydene Flock [phone] Residential & Outpatient Substance Abuse Program  [phone]  Abuse/Neglect [facility] Child Abuse Hotline 812-204-8323 [facility] Child Abuse Hotline [phone] (After Hours)  Emergency Shelter [facility] 540-875-2500  M[facility] [phone] [facility] [phone]  [facility] Resources  [facility]     [employer]                          [facility]. [address] [place]                       [address]      [address]  Blondell Reveal Phone:  [phone]                                   Phone:  [phone]                 Phone:  [phone]  [facility] Mental [facility] Phone:  [phone]  [facility] Child Abuse Hotline 609-872-4412 [phone] (After Hours)

## 2011-11-01 NOTE — Progress Notes (Signed)
Orthopedic Tech Progress Note Patient Details:  Victoria Sosa 1990-12-09 [id number]  Other Ortho Devices Type of Ortho Device: Crutches;ASO Ortho Device Location: (R) LE Ortho Device Interventions: Application   Jennye Moccasin 11/01/2011, 10:57 PM

## 2011-11-01 NOTE — ED Notes (Signed)
PT. REPORTS ANKLE PAIN , TWISTED ANKLE WITH GOING DOWN STAIRS ON HIGH HEELS THIS EVENING .

## 2011-11-03 NOTE — ED Provider Notes (Signed)
Medical screening examination/treatment/procedure(s) were performed by non-physician practitioner and as supervising physician I was immediately available for consultation/collaboration.   Geoffery Lyons, MD 11/03/11 [phone]

## 2011-12-02 ENCOUNTER — Emergency Department: Payer: Self-pay | Admitting: Emergency Medicine

## 2012-01-09 ENCOUNTER — Encounter (HOSPITAL_BASED_OUTPATIENT_CLINIC_OR_DEPARTMENT_OTHER): Payer: Self-pay | Admitting: *Deleted

## 2012-01-09 ENCOUNTER — Emergency Department (HOSPITAL_BASED_OUTPATIENT_CLINIC_OR_DEPARTMENT_OTHER)
Admission: EM | Admit: 2012-01-09 | Discharge: 2012-01-10 | Discharge status: Against Medical Advice | Payer: Managed Care, Other (non HMO) | Attending: Emergency Medicine | Admitting: Emergency Medicine

## 2012-01-09 DIAGNOSIS — F411 Generalized anxiety disorder: Secondary | ICD-10-CM | POA: Insufficient documentation

## 2012-01-09 DIAGNOSIS — R10819 Abdominal tenderness, unspecified site: Secondary | ICD-10-CM | POA: Insufficient documentation

## 2012-01-09 DIAGNOSIS — R000 Tachycardia, unspecified: Secondary | ICD-10-CM

## 2012-01-09 DIAGNOSIS — R51 Headache: Secondary | ICD-10-CM | POA: Insufficient documentation

## 2012-01-09 DIAGNOSIS — R110 Nausea: Secondary | ICD-10-CM | POA: Insufficient documentation

## 2012-01-09 LAB — CBC
HCT: 38.7 % (ref 36.0–46.0)
Hemoglobin: 13.6 g/dL (ref 12.0–15.0)
MCH: 26.8 pg (ref 26.0–34.0)
MCHC: 35.1 g/dL (ref 30.0–36.0)
MCV: 76.2 fL — ABNORMAL LOW (ref 78.0–100.0)

## 2012-01-09 LAB — POCT I-STAT 3, VENOUS BLOOD GAS (G3P V)
Acid-base deficit: 10 mmol/L — ABNORMAL HIGH (ref 0.0–2.0)
Bicarbonate: 18.2 mEq/L — ABNORMAL LOW (ref 20.0–24.0)
O2 Saturation: 98 %
Patient temperature: 98.7
TCO2: 20 mmol/L (ref 0–100)

## 2012-01-09 LAB — DIFFERENTIAL
Basophils Relative: 0 % (ref 0–1)
Eosinophils Absolute: 0.1 10*3/uL (ref 0.0–0.7)
Monocytes Absolute: 0.8 10*3/uL (ref 0.1–1.0)
Monocytes Relative: 6 % (ref 3–12)

## 2012-01-09 LAB — GLUCOSE, CAPILLARY: Glucose-Capillary: 104 mg/dL — ABNORMAL HIGH (ref 70–99)

## 2012-01-09 MED ORDER — SODIUM CHLORIDE 0.9 % IV BOLUS (SEPSIS)
1000.0000 mL | Freq: Once | INTRAVENOUS | Status: AC
  Administered 2012-01-09: 1000 mL via INTRAVENOUS

## 2012-01-09 MED ORDER — ONDANSETRON HCL 4 MG/2ML IJ SOLN
4.0000 mg | Freq: Once | INTRAMUSCULAR | Status: AC
  Administered 2012-01-10: 4 mg via INTRAVENOUS
  Filled 2012-01-09: qty 2

## 2012-01-09 NOTE — ED Notes (Signed)
Pt states she has had problems with palpitations before when she went back on her ADHD meds, but has had no changes recently. Noticed it earlier today, but gradually got worse tonight. H/A x 1 week and abd pain for 2 weeks. Fever prior to any of that.

## 2012-01-09 NOTE — ED Notes (Signed)
MD at bedside. 

## 2012-01-09 NOTE — ED Provider Notes (Signed)
History   This chart was scribed for Cyndra Numbers, MD by Shari Heritage. The patient was seen in room MH07/MH07. Patient's care was started at 2229.     CSN: [id number]  Arrival date & time 01/09/12  2229   First MD Initiated Contact with Patient 01/09/12 2311      Chief Complaint  Patient presents with  . Palpitations    (Consider location/radiation/quality/duration/timing/severity/associated sxs/prior treatment) Patient is a 21 y.o. female presenting with palpitations. The history is provided by the patient. No language interpreter was used.  Palpitations  Associated symptoms include nausea and headaches. Pertinent negatives include no diaphoresis, no fever, no chest pain and no cough.   Victoria Sosa is a 21 y.o. female who presents to the Emergency Department complaining of intense palpitations onset this afternoon. Patient says she was shopping for furniture and spending time with friends when palpitations began associated with severe abdominal pain and headache, and nausea. Patient has chronic pain in her left upper abdomen and has additional diffuse, constant abdominal pain that she describes as 8/10. 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 Blood glucose levels have been elevated recently per patient.  She reports that she has been following up on these with her diabetic educator and that she has had worsened abdominal pain for the past few weeks. Her diabetic educator has been encouraging her to go to the emergency department for this. The patient has still not seen a physician for this and reports that she is here today because of her palpitations. Patient hasn't ingested any caffeine today. Patient is not pregnant and takes Depo provera. Patient's last period was before she began taking Depo provera. Denies dysuria, hematuria, SOB.  Hasn't experienced a cough or other flu-like symptoms.  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  She does report that she takes medications for ADD and often has heart rates as high as 115 with these. She has not taken any of these per her report.   Surgeon - Elissa Hefty PCP: Dr. Alonna Buckler  Past Medical History  Diagnosis Date  . Diabetes mellitus   . Asthma   . Thyroid disease   . ADHD (attention deficit hyperactivity disorder)   . Hypothyroidism     Past Surgical History  Procedure Date  . Tonsillectomy   . Adnoid   . Splenic cystectomy     History reviewed. No pertinent family history.  History  Substance Use Topics  . Smoking status: Never Smoker   . Smokeless tobacco: Not on file  . Alcohol Use: Yes    OB History    Grav Para Term Preterm Abortions TAB SAB Ect Mult Living                  Review of Systems  Constitutional: Negative for fever, chills and diaphoresis.  HENT: Negative for trouble swallowing.   Eyes: Negative.   Respiratory: Negative.  Negative for cough.   Cardiovascular: Positive for palpitations. Negative for chest pain.  Gastrointestinal: Positive for nausea.  Genitourinary: Negative.  Negative for dysuria and hematuria.  Musculoskeletal: Negative.   Skin: Negative.   Neurological: Positive for headaches.  Hematological: Negative.   Psychiatric/Behavioral: Negative.   All other systems reviewed and are negative.   A complete 10 system review of systems was obtained and all systems are negative except as noted in the HPI and PMH.   Allergies  Amoxicillin;  Azithromycin; Barium-containing compounds; Contrast media; Fish allergy; Gelatin; Iodine; Latex; Nutritional supplements; Other; Oxycodone; Peanut-containing drug products; Penicillins; and Shellfish allergy  Home Medications   Current Outpatient Rx  Name Route Sig Dispense Refill  . ALBUTEROL SULFATE HFA 108 (90 BASE) MCG/ACT IN AERS Inhalation Inhale 2 puffs into the lungs every 6 (six) hours as needed. For wheezing    .  DIPHENHYDRAMINE HCL 12.5 MG/5ML PO ELIX Oral Take 100 mg by mouth 2 (two) times daily.    Marland Kitchen EPINEPHRINE 0.3 MG/0.3ML IJ DEVI Intramuscular Inject 0.3 mg into the muscle once.    Marland Kitchen FAMOTIDINE 20 MG PO TABS Oral Take 20 mg by mouth 2 (two) times daily.      Marland Kitchen FEXOFENADINE HCL 30 MG PO TABS Oral Take 30 mg by mouth 2 (two) times daily.      Marland Kitchen FLUTICASONE-SALMETEROL 250-50 MCG/DOSE IN AEPB Inhalation Inhale 1 puff into the lungs every 12 (twelve) hours.      Marland Kitchen GLUCAGON (RDNA) 1 MG IJ KIT Intravenous Inject 0.25-1 mg into the vein once as needed. Low blood sugar    . HYDROCORTISONE 20 MG PO TABS Oral Take 20 mg by mouth 2 (two) times daily.    Marland Kitchen HYDROCORTISONE SOD SUCCINATE 100 MG IJ SOLR Intravenous Inject 100 mg into the vein as needed. For adrenal crisis      . IBUPROFEN 100 MG/5ML PO SUSP Oral Take 400 mg by mouth every 4 (four) hours as needed. For pain    . INSULIN PUMP Subcutaneous Inject into the skin continuous. humalog    . LEVOTHYROXINE SODIUM 150 MCG PO TABS Oral Take 150 mcg by mouth daily.    Marland Kitchen LORATADINE 10 MG PO TABS Oral Take 10 mg by mouth daily.      Marland Kitchen MEDROXYPROGESTERONE ACETATE 150 MG/ML IM SUSP Intramuscular Inject 150 mg into the muscle every 3 (three) months.    . METHYLPHENIDATE 30 MG/9HR TD PTCH Transdermal Place 1 patch onto the skin daily. wear patch for 9 hours only each day     . METHYLPHENIDATE HCL 10 MG PO TABS Oral Take 10 mg by mouth daily as needed. If patch wears off....for ADD    . MONTELUKAST SODIUM 10 MG PO TABS Oral Take 10 mg by mouth daily.     . OLOPATADINE HCL 0.1 % OP SOLN Both Eyes Place 1 drop into both eyes 2 (two) times daily.      Marland Kitchen ZOFRAN ODT PO Oral Take 1 tablet by mouth 2 (two) times daily as needed. For nausea      BP 134/88  Pulse 141  Temp(Src) 98.3 F (36.8 C) (Oral)  Resp 20  Ht 5\' 5"  (1.651 m)  Wt 260 lb (117.935 kg)  BMI 43.27 kg/m2  SpO2 100%  Physical Exam  Nursing note and vitals reviewed. Constitutional: She is oriented to  person, place, and time. She appears well-developed and well-nourished. No distress.  HENT:  Head: Normocephalic and atraumatic.  Eyes: Conjunctivae and EOM are normal. Pupils are equal, round, and reactive to light.  Neck: Normal range of motion. Neck supple.  Cardiovascular: Regular rhythm.  Tachycardia present.  Exam reveals no gallop.   No murmur heard.      Tachycardic  Pulmonary/Chest: Effort normal and breath sounds normal. No respiratory distress. She has no wheezes. She exhibits no tenderness.  Abdominal: Soft. Bowel sounds are normal. She exhibits no distension. There is tenderness (Diffuse tenderness to palpation). There is no rebound and no guarding.  Musculoskeletal:  Normal range of motion. She exhibits no edema.  Neurological: She is alert and oriented to person, place, and time. No cranial nerve deficit. She exhibits normal muscle tone. Coordination normal.  Skin: Skin is warm and dry.  Psychiatric:       anxious    ED Course  Procedures (including critical care time)   Date: 01/09/2012  Rate: 143  Rhythm: sinus tachycardia  QRS Axis: normal  Intervals: normal  ST/T Wave abnormalities: nonspecific ST/T changes  Conduction Disutrbances:none  Narrative Interpretation:   Old EKG Reviewed: none available   DIAGNOSTIC STUDIES: Oxygen Saturation is 100% on room air, normal by my interpretation.    COORDINATION OF CARE: 11:18PM- Patient informed of current plan for treatment and evaluation and agrees with plan at this time. Blood glucose level is 104. Will administer IV fluids.  Patient refuses pain medications because she reports they have caused anaphylaxis in the past.   Labs Reviewed  CBC - Abnormal; Notable for the following:    WBC 13.2 (*)    MCV 76.2 (*)    Platelets 406 (*)    All other components within normal limits  DIFFERENTIAL - Abnormal; Notable for the following:    Neutro Abs 9.5 (*)    All other components within normal limits  COMPREHENSIVE  METABOLIC PANEL - Abnormal; Notable for the following:    Total Protein 8.5 (*)    All other components within normal limits  GLUCOSE, CAPILLARY - Abnormal; Notable for the following:    Glucose-Capillary 104 (*)    All other components within normal limits  POCT I-STAT 3, BLOOD GAS (G3P V) - Abnormal; Notable for the following:    pH, Ven 7.192 (*)    pO2, Ven 131.0 (*)    Bicarbonate 18.2 (*)    Acid-base deficit 10.0 (*)    All other components within normal limits  LIPASE, BLOOD  KETONES, QUALITATIVE  URINALYSIS, ROUTINE W REFLEX MICROSCOPIC  PREGNANCY, URINE  URINE RAPID DRUG SCREEN (HOSP PERFORMED)  BLOOD GAS, VENOUS   No results found.   1. Tachycardia       MDM  Patient presented complaining of palpitations that have been present for most of the day. Patient had a heart rate that was in the 140s at its highest but was otherwise clinically stable. Patient had no signs of acute heart failure with this. She was afebrile. Patient complained of abdominal pain. She has chronic abdominal pain but felt that today her symptoms were somewhat worse. Patient is a diabetic but had no ketonuria, no anion gap, and no hyperglycemia here today. Patient was given a liter of normal saline IV bolus. Urinalysis showed no signs of infection. Liver panel and lipase were unremarkable. Patient had a white count of 13.2. Last labs performed at [facility] on May 7 showed a white count of 11.1. Patient was given a liter of normal saline IV bolus. She could not be treated for pain as patient reported anaphylaxis to all pain medications. She did not have a CT angiogram the chest to evaluate for possible pulmonary embolus as she reported IV contrast dye allergy with history of anaphylaxis. She did not have an acute abdomen and also reported that she could not have a CT of the abdomen with contrast do to IV dye allergy. I spoke with Dr. Lonzo Cloud, the on-call attending physician over the patient's primary care  provider at [facility]. She reviewed the patient's chart and noted that the patient has numerous presentations and EKGs  with tachycardia. Patient also has had evaluations of her allergies numerous times and he is having questioned. Patient has had treatment with IV fluids by her boyfriend who is an EMT and an unauthorized fashion previously. Despite all of this I did reiterate that the patient had sinus tachycardia with a heart rate between 130 and 140 today and that I could not complete her evaluation given her numerous allergies. Patient would require transfer for possible VQ scan or observation as her evaluation cannot be completed at our stand alone in the ED overnight. As patient has had many physicians at [facility] and a complicated medical history if she were to be transferred she would be best served by being sent to [facility]. Dr. Chestine Spore recommended that we first try a urine drug screen and a dose of Ativan. When I reevaluated the patient her heart rate was seen as low as 110 while she was speaking with me. When she became upset her heart rate would jump up to the 130s again. Patient reported that she just wanted to go see her regular doctors. I explained to her evaluation was incomplete and that there were risks to her choosing to drive and leaving. The risks of death and deterioration were discussed. She signed out AMA.  Had she stayed patient would have continued to receive IV fluids with reevaluation and repeat consultation with [facility] medical staff for likely transfer for observation admission if tachycardia had not resolved.     I personally performed the services described in this documentation, which was scribed in my presence. The recorded information has been reviewed and considered.       Cyndra Numbers, MD 01/10/12 [phone]

## 2012-01-10 LAB — RAPID URINE DRUG SCREEN, HOSP PERFORMED
Amphetamines: NOT DETECTED
Barbiturates: NOT DETECTED
Tetrahydrocannabinol: NOT DETECTED

## 2012-01-10 LAB — URINALYSIS, ROUTINE W REFLEX MICROSCOPIC
Bilirubin Urine: NEGATIVE
Ketones, ur: NEGATIVE mg/dL
Nitrite: NEGATIVE
Specific Gravity, Urine: 1.013 (ref 1.005–1.030)
Urobilinogen, UA: 0.2 mg/dL (ref 0.0–1.0)

## 2012-01-10 LAB — COMPREHENSIVE METABOLIC PANEL
Albumin: 4.3 g/dL (ref 3.5–5.2)
BUN: 10 mg/dL (ref 6–23)
Chloride: 102 mEq/L (ref 96–112)
Creatinine, Ser: 0.6 mg/dL (ref 0.50–1.10)
Total Bilirubin: 0.3 mg/dL (ref 0.3–1.2)
Total Protein: 8.5 g/dL — ABNORMAL HIGH (ref 6.0–8.3)

## 2012-01-10 LAB — LIPASE, BLOOD: Lipase: 27 U/L (ref 11–59)

## 2012-01-10 LAB — KETONES, QUALITATIVE: Acetone, Bld: NEGATIVE

## 2012-01-10 MED ORDER — LORAZEPAM 2 MG/ML IJ SOLN
1.0000 mg | Freq: Once | INTRAMUSCULAR | Status: DC
  Filled 2012-01-10: qty 1

## 2012-01-10 MED ORDER — SODIUM CHLORIDE 0.9 % IV BOLUS (SEPSIS): 1000.0000 mL | Freq: Once | INTRAVENOUS | Status: DC

## 2012-01-10 NOTE — ED Notes (Signed)
Pt declined to stay for further monitoring and treatment of tachycardia after counseling by EDP Hunt- signed ama papers- VSS- ambulatory without difficulty

## 2012-01-10 NOTE — Discharge Instructions (Signed)
Tachycardia, Nonspecific  In adults, the heart normally beats between 60 and 100 times a minute. A heart rate over 100 is called tachycardia. When your heart beats too fast, it may not be able to pump enough blood to the rest of the body.  CAUSES    Exercise or exertion.   Fever.   Pain or injury.   Infection.   Loss of fluid (dehydration).   Overactive thyroid.   Lack of red blood cells (anemia).   Anxiety.   Alcohol.   Heart arrhythmia.   Caffeine.   Tobacco products.   Diet pills.   Street drugs.   Heart disease.  SYMPTOMS   Palpitations (rapid or irregular heartbeat).   Dizziness.   Tiredness (fatigue).   Shortness of breath.  DIAGNOSIS   After an exam and taking a history, your caregiver may order:   Blood tests.   Electrocardiogram (EKG).   Heart monitor.  TREATMENT   Treatment will depend on the cause and potential for harm. It may include:   Intravenous (IV) replacement of fluids or blood.   Antidote or reversal medicines.   Changes in your present medicines.   Lifestyle changes.  HOME CARE INSTRUCTIONS    Get rest.   Drink enough water and fluids to keep your urine clear or pale yellow.   Avoid:   Caffeine.   Nicotine.   Alcohol.   Stress.   Chocolate.   Stimulants.   Only take medicine as directed by your caregiver.  SEEK IMMEDIATE MEDICAL CARE IF:    You have pain in your chest, upper arms, jaw, or neck.   You become weak, dizzy, or feel faint.   You have palpitations that will not go away.   You throw up (vomit), have diarrhea, or pass blood.   You look pale and your skin is cool and wet.  MAKE SURE YOU:    Understand these instructions.   Will watch your condition.   Will get help right away if you are not doing well or get worse.  Document Released: 09/17/2004 Document Revised: 07/30/2011 Document Reviewed: 07/21/2011  [employer] Patient Information 2012 [employer].

## 2012-02-17 ENCOUNTER — Encounter (HOSPITAL_COMMUNITY): Payer: Self-pay | Admitting: Emergency Medicine

## 2012-02-17 ENCOUNTER — Emergency Department (HOSPITAL_COMMUNITY)
Admission: EM | Admit: 2012-02-17 | Discharge: 2012-02-17 | Disposition: A | Discharge status: Home / Self Care | Payer: Managed Care, Other (non HMO) | Attending: Emergency Medicine | Admitting: Emergency Medicine

## 2012-02-17 DIAGNOSIS — I10 Essential (primary) hypertension: Secondary | ICD-10-CM | POA: Insufficient documentation

## 2012-02-17 DIAGNOSIS — Z79899 Other long term (current) drug therapy: Secondary | ICD-10-CM | POA: Insufficient documentation

## 2012-02-17 DIAGNOSIS — T7840XA Allergy, unspecified, initial encounter: Secondary | ICD-10-CM

## 2012-02-17 DIAGNOSIS — F909 Attention-deficit hyperactivity disorder, unspecified type: Secondary | ICD-10-CM | POA: Insufficient documentation

## 2012-02-17 DIAGNOSIS — E119 Type 2 diabetes mellitus without complications: Secondary | ICD-10-CM | POA: Insufficient documentation

## 2012-02-17 DIAGNOSIS — J45909 Unspecified asthma, uncomplicated: Secondary | ICD-10-CM | POA: Insufficient documentation

## 2012-02-17 DIAGNOSIS — Z9641 Presence of insulin pump (external) (internal): Secondary | ICD-10-CM | POA: Insufficient documentation

## 2012-02-17 DIAGNOSIS — E039 Hypothyroidism, unspecified: Secondary | ICD-10-CM | POA: Insufficient documentation

## 2012-02-17 MED ORDER — METHYLPREDNISOLONE SODIUM SUCC 125 MG IJ SOLR
125.0000 mg | Freq: Once | INTRAMUSCULAR | Status: AC
  Administered 2012-02-17: 125 mg via INTRAVENOUS
  Filled 2012-02-17: qty 2

## 2012-02-17 MED ORDER — IPRATROPIUM BROMIDE 0.02 % IN SOLN
RESPIRATORY_TRACT | Status: AC
  Administered 2012-02-17: 11:00:00
  Filled 2012-02-17: qty 2.5

## 2012-02-17 MED ORDER — DIPHENHYDRAMINE HCL 50 MG/ML IJ SOLN
INTRAMUSCULAR | Status: AC
  Administered 2012-02-17: 11:00:00
  Filled 2012-02-17: qty 1

## 2012-02-17 MED ORDER — ALBUTEROL SULFATE (5 MG/ML) 0.5% IN NEBU
INHALATION_SOLUTION | RESPIRATORY_TRACT | Status: AC
  Administered 2012-02-17: 11:00:00
  Filled 2012-02-17: qty 2

## 2012-02-17 NOTE — Discharge Instructions (Signed)
Return to ER at any time for emergent changing or worsening of symptoms  Allergic Reaction Allergic reactions can be caused by anything your body is sensitive to. Your body may be sensitive to food, medicines, molds, pollens, cockroaches, dust mites, pets, insect stings, and other things around you. An allergic reaction may cause puffiness (swelling), itching, sneezing, coughing, or problems breathing.  Allergies cannot be cured, but they can be controlled with medicine. Some allergies happen only at certain times of the year. Try to stay away from what causes your reaction if possible. Sometimes, it is hard to tell what causes your reaction. HOME CARE If you have a rash or red patches (hives) on your skin:  Take medicines as told by your doctor.   Do not drive or drink alcohol after taking medicines. They can make you sleepy.   Put cold cloths on your skin. Take baths in cool water. This will help your itching. Do not take hot baths or showers. Heat will make the itching worse.   If your allergies get worse, your doctor might give you other medicines. Talk to your doctor if problems continue.  GET HELP RIGHT AWAY IF:   You have trouble breathing.   You have a tight feeling in your chest or throat.   Your mouth gets puffy (swollen).   You have red, itchy patches on your skin (hives) that get worse.   You have itching all over your body.  MAKE SURE YOU:   Understand these instructions.   Will watch your condition.   Will get help right away if you are not doing well or get worse.  Document Released: 07/29/2009 Document Revised: 07/30/2011 Document Reviewed: 07/29/2009 [facility] Patient Information 2012 [place], [place].

## 2012-02-17 NOTE — ED Provider Notes (Signed)
History/physical exam/procedure(s) were performed by non-physician practitioner and as supervising physician I was immediately available for consultation/collaboration. I have reviewed all notes and am in agreement with care and plan.   Kebrina Friend S Latanya Hemmer, MD 02/17/12 1609 

## 2012-02-17 NOTE — ED Provider Notes (Signed)
History     CSN: [id number]  Arrival date & time 02/17/12  1006   First MD Initiated Contact with Patient 02/17/12 1008      Chief Complaint  Patient presents with  . Allergic Reaction    (Consider location/radiation/quality/duration/timing/severity/associated sxs/prior treatment) HPI  Patient is brought to ER by EMS with complaint of allergic reaction just PTA to unknown substance. She has numerous allergies he states because of her extreme sensitivities to multiple things she has been seen by multiple different institutes for evaluation of her allergies and allergic reactions but primary allergists are at [facility]. Patient states that she was in an office building that had construction zone nearby and she began to have acute onset difficulty breathing stating she felt her throat was closing and therefore she self injected her EpiPen. Patient states she has used her EpiPen innumerable times throughout her lifetime and has also required intubation because of extreme reactions x multiple times. Patient states she can only assume the construction zone had latex paint or some other substance she is allergic to. By time of arrival to ER patient states she's feeling much improved. She was given IM Benadryl and a Combivent neb inhaler in route. She takes daily prednisone, 60 mg twice a day as well as daily Benadryl. Symptoms were acute onset and resolved.   Past Medical History  Diagnosis Date  . Diabetes mellitus   . Asthma   . Thyroid disease   . ADHD (attention deficit hyperactivity disorder)   . Hypothyroidism     Past Surgical History  Procedure Date  . Tonsillectomy   . Adnoid   . Splenic cystectomy     No family history on file.  History  Substance Use Topics  . Smoking status: Never Smoker   . Smokeless tobacco: Not on file  . Alcohol Use: Yes    OB History    Grav Para Term Preterm Abortions TAB SAB Ect Mult Living                  Review of Systems  All other  systems reviewed and are negative.    Allergies  Amoxicillin; Azithromycin; Barium-containing compounds; Contrast media; Fish allergy; Gelatin; Iodine; Latex; Nutritional supplements; Other; Oxycodone; Peanut-containing drug products; Penicillins; and Shellfish allergy  Home Medications   Current Outpatient Rx  Name Route Sig Dispense Refill  . ALBUTEROL SULFATE HFA 108 (90 BASE) MCG/ACT IN AERS Inhalation Inhale 2 puffs into the lungs every 6 (six) hours as needed. For wheezing    . DIPHENHYDRAMINE HCL 12.5 MG/5ML PO ELIX Oral Take 100 mg by mouth 2 (two) times daily.    Marland Kitchen EPINEPHRINE 0.3 MG/0.3ML IJ DEVI Intramuscular Inject 0.3 mg into the muscle once.    Marland Kitchen FAMOTIDINE 20 MG PO TABS Oral Take 20 mg by mouth 2 (two) times daily.      Marland Kitchen FEXOFENADINE HCL 30 MG PO TABS Oral Take 30 mg by mouth 2 (two) times daily.      Marland Kitchen FLUTICASONE-SALMETEROL 250-50 MCG/DOSE IN AEPB Inhalation Inhale 1 puff into the lungs every 12 (twelve) hours.      Marland Kitchen GLUCAGON (RDNA) 1 MG IJ KIT Intravenous Inject 0.25-1 mg into the vein once as needed. Low blood sugar    . HYDROCORTISONE 20 MG PO TABS Oral Take 20 mg by mouth 2 (two) times daily.    Marland Kitchen HYDROCORTISONE SOD SUCCINATE 100 MG IJ SOLR Intravenous Inject 100 mg into the vein as needed. For adrenal crisis      .  IBUPROFEN 100 MG/5ML PO SUSP Oral Take 400 mg by mouth every 4 (four) hours as needed. For pain    . INSULIN PUMP Subcutaneous Inject into the skin continuous. humalog    . LEVOTHYROXINE SODIUM 150 MCG PO TABS Oral Take 150 mcg by mouth daily.    Marland Kitchen LORATADINE 10 MG PO TABS Oral Take 10 mg by mouth daily.      Marland Kitchen MEDROXYPROGESTERONE ACETATE 150 MG/ML IM SUSP Intramuscular Inject 150 mg into the muscle every 3 (three) months.    . METHYLPHENIDATE 30 MG/9HR TD PTCH Transdermal Place 1 patch onto the skin daily. wear patch for 9 hours only each day     . METHYLPHENIDATE HCL 10 MG PO TABS Oral Take 10 mg by mouth daily as needed. If patch wears off....for ADD     . MONTELUKAST SODIUM 10 MG PO TABS Oral Take 10 mg by mouth daily.     . OLOPATADINE HCL 0.1 % OP SOLN Both Eyes Place 1 drop into both eyes 2 (two) times daily.      Marland Kitchen ZOFRAN ODT PO Oral Take 1 tablet by mouth 2 (two) times daily as needed. For nausea      SpO2 99%  Physical Exam  Nursing note and vitals reviewed. Constitutional: She is oriented to person, place, and time. She appears well-developed and well-nourished. No distress.  HENT:  Head: Normocephalic and atraumatic.       Patent airway  Eyes: Conjunctivae are normal.  Neck: Normal range of motion. Neck supple.  Cardiovascular: Normal rate, regular rhythm, normal heart sounds and intact distal pulses.  Exam reveals no gallop and no friction rub.   No murmur heard. Pulmonary/Chest: Effort normal and breath sounds normal. No respiratory distress. She has no wheezes. She has no rales. She exhibits no tenderness.  Abdominal: Soft. Bowel sounds are normal. She exhibits no distension and no mass. There is no tenderness. There is no rebound and no guarding.  Musculoskeletal: Normal range of motion. She exhibits no edema and no tenderness.  Neurological: She is alert and oriented to person, place, and time.  Skin: Skin is warm and dry. No rash noted. She is not diaphoretic. No erythema.  Psychiatric: She has a normal mood and affect.    ED Course  Procedures (including critical care time)  Patient received benadryl 50mg  IM, albuterol 10mg  neb and 0.5mg  of atrovent in route.   IVP solumedrol in ER.   Patient evaluated by Dr. Rosalia Hammers at bedside and agrees to monitor patient before d/c home to make sure she does not have immediate biphasic reacation.   Labs Reviewed - No data to display No results found.   1. Allergic reaction       MDM  Patient's symptoms improved in route and resolved by time of arrival to the ER and she remains symptom free while in ER however patient states that she has to leave the emergency department  now and does not want to remain here to be monitored. Sereing that she gave herself an epinephrine shot just PTA it is our medical recommendation to be monitored for 3 hours especially in light of her history of biphasic reactions. Patient voices her understanding that leaving AMA could be detrimental to her health. She states she will return immediately to emergency department for any changing or worsening symptoms however if she does desire to leave [facility]        Drucie Opitz, PA 02/17/12 1129

## 2012-02-17 NOTE — ED Notes (Signed)
Per EMS, pt states she was in class and starting itching, wheezing-gave self epi pen-given benedryl 50mg , 10 albterol, and .5mg  atrovent in route-pt states positive response to meds

## 2012-02-17 NOTE — ED Notes (Signed)
[id number]<GN> Expected date:<BR> Expected time:<BR> Means of arrival:<BR> Comments:<BR> Allergic reaction

## 2012-02-17 NOTE — ED Notes (Signed)
Patient placed on heart monitor d/t tachycardia. Tried to change patient into a gown but patient says that she has had allergic reactions to the detergent in the gowns before. Patient currently still in her tank top and jeans.

## 2012-02-23 ENCOUNTER — Encounter (HOSPITAL_COMMUNITY): Payer: Self-pay | Admitting: *Deleted

## 2012-02-23 ENCOUNTER — Emergency Department (INDEPENDENT_AMBULATORY_CARE_PROVIDER_SITE_OTHER)
Admission: EM | Admit: 2012-02-23 | Discharge: 2012-02-23 | Disposition: A | Discharge status: Home / Self Care | Payer: Managed Care, Other (non HMO) | Source: Home / Self Care

## 2012-02-23 DIAGNOSIS — R000 Tachycardia, unspecified: Secondary | ICD-10-CM

## 2012-02-23 DIAGNOSIS — F411 Generalized anxiety disorder: Secondary | ICD-10-CM

## 2012-02-23 NOTE — ED Notes (Signed)
Pt  Reports  Symptoms  Of   Sensation of  Her  Heart  Racing       For  About  30  mins  She  Also  Reports  Sensation of pins  And  Needles  And   Hands           She   Reports  She  Was  Seen   aboy 6  Days  Ago  For  An allergic  Reaction    She  denys  Any  Chest  Pain    She  Is  Speaking in  Complete  sentances  And  Is  In no  Acute  Distress  Her  Skin is  Warm  And  Dry  And  Her  Lungs  Are  Clear

## 2012-02-23 NOTE — ED Provider Notes (Signed)
History     CSN: [id number]  Arrival date & time 02/23/12  1713   None     Chief Complaint  Patient presents with  . Tachycardia    (Consider location/radiation/quality/duration/timing/severity/associated sxs/prior treatment) The history is provided by the patient.  Victoria Sosa is a 21 y.o. female who complains of tachycardia with palpitations after leaving class this afternoon.  States previous episode of same when she abruptly was taking off ADH medication and when given epinephrine for allergic reaction.  Symptoms not associated with chest pain, dizziness, syncope or sob.  States she was recently seen by her endocrinologist where her labwork was WNL (including TSH).  Denies known cardiac pmh, denies recent lifestyle stressors, no recent illnesses, denies caffeine intake, no drug or etoh use.     Past Medical History  Diagnosis Date  . Diabetes mellitus   . Asthma   . Thyroid disease   . ADHD (attention deficit hyperactivity disorder)   . Hypothyroidism     Past Surgical History  Procedure Date  . Tonsillectomy   . Adnoid   . Splenic cystectomy     No family history on file.  History  Substance Use Topics  . Smoking status: Never Smoker   . Smokeless tobacco: Not on file  . Alcohol Use: Yes    OB History    Grav Para Term Preterm Abortions TAB SAB Ect Mult Living                  Review of Systems  All other systems reviewed and are negative.    Allergies  Amoxicillin; Azithromycin; Barium-containing compounds; Contrast media; Fish allergy; Gelatin; Ibuprofen; Iodine; Latex; Nutritional supplements; Other; Oxycodone; Peanut-containing drug products; Penicillins; and Shellfish allergy  Home Medications   Current Outpatient Rx  Name Route Sig Dispense Refill  . ALBUTEROL SULFATE HFA 108 (90 BASE) MCG/ACT IN AERS Inhalation Inhale 2 puffs into the lungs every 6 (six) hours as needed. For wheezing    . EPINEPHRINE 0.3 MG/0.3ML IJ DEVI Intramuscular  Inject 0.3 mg into the muscle once.    Marland Kitchen FAMOTIDINE 20 MG PO TABS Oral Take 20 mg by mouth 2 (two) times daily.     Marland Kitchen FEXOFENADINE HCL 30 MG PO TABS Oral Take 30 mg by mouth 2 (two) times daily.     Marland Kitchen FLUTICASONE-SALMETEROL 250-50 MCG/DOSE IN AEPB Inhalation Inhale 1 puff into the lungs every 12 (twelve) hours.     Marland Kitchen GLUCAGON (RDNA) 1 MG IJ KIT Intravenous Inject 0.25-1 mg into the vein once as needed. Low blood sugar    . HYDROCORTISONE 20 MG PO TABS Oral Take 20 mg by mouth 2 (two) times daily.    Marland Kitchen HYDROCORTISONE SOD SUCCINATE 100 MG IJ SOLR Intravenous Inject 100 mg into the vein once as needed. For adrenal crisis     . INSULIN PUMP Subcutaneous Inject into the skin continuous. humalog    . LEVOTHYROXINE SODIUM 150 MCG PO TABS Oral Take 150 mcg by mouth daily.    Marland Kitchen LORATADINE 10 MG PO TABS Oral Take 10 mg by mouth 2 (two) times daily.     Marland Kitchen MEDROXYPROGESTERONE ACETATE 150 MG/ML IM SUSP Intramuscular Inject 150 mg into the muscle every 3 (three) months.    . METHYLPHENIDATE 30 MG/9HR TD PTCH Transdermal Place 1 patch onto the skin daily. wear patch for 9 hours only each day    . METHYLPHENIDATE HCL 10 MG PO TABS Oral Take 10 mg by mouth daily as needed. If  patch wears off....for ADD    . MONTELUKAST SODIUM 10 MG PO TABS Oral Take 10 mg by mouth daily.     . OLOPATADINE HCL 0.1 % OP SOLN Both Eyes Place 1 drop into both eyes 3 (three) times daily.     Marland Kitchen OVER THE COUNTER MEDICATION Oral Take 100 mg by mouth 2 (two) times daily. Benadryl Allergy Quick Dissolve strip, 25mg  per strip      BP 128/73  Pulse 144  Temp 98.1 F (36.7 C) (Oral)  Resp 16  SpO2 98%  Physical Exam  Nursing note and vitals reviewed. Constitutional: She is oriented to person, place, and time. Vital signs are normal. She appears well-developed and well-nourished. She is active and cooperative.  HENT:  Head: Normocephalic.  Eyes: Conjunctivae are normal. Pupils are equal, round, and reactive to light. No scleral  icterus.  Neck: Trachea normal. Neck supple. Normal carotid pulses present. Carotid bruit is not present.  Cardiovascular: Regular rhythm, normal heart sounds, intact distal pulses and normal pulses.  Tachycardia present.   Pulmonary/Chest: Effort normal and breath sounds normal.  Abdominal: Bowel sounds are normal. She exhibits no pulsatile liver and no mass. There is no hepatosplenomegaly. There is no tenderness.  Neurological: She is alert and oriented to person, place, and time. She has normal strength. No cranial nerve deficit or sensory deficit. GCS eye subscore is 4. GCS verbal subscore is 5. GCS motor subscore is 6.  Skin: Skin is warm and dry. She is not diaphoretic. No pallor.  Psychiatric: Her speech is normal and behavior is normal. Judgment and thought content normal. Her mood appears anxious. Cognition and memory are normal.    ED Course  Procedures (including critical care time)  Labs Reviewed - No data to display No results found.   1. Tachycardia   2. Anxiety state       MDM  Follow up with PCP or psychiatrist for further evaluation and etiology of tachycardia, ekg today normal.        Johnsie Kindred, NP 02/23/12 2206

## 2012-02-25 NOTE — ED Provider Notes (Signed)
Medical screening examination/treatment/procedure(s) were performed by non-physician practitioner and as supervising physician I was immediately available for consultation/collaboration.  Luiz Blare MD   Luiz Blare, MD 02/25/12 1128

## 2012-04-01 ENCOUNTER — Emergency Department: Payer: Self-pay | Admitting: Emergency Medicine

## 2012-04-01 LAB — CBC WITH DIFFERENTIAL/PLATELET
Basophil %: 0.6 %
Eosinophil #: 0.3 10*3/uL (ref 0.0–0.7)
Eosinophil %: 2.3 %
HCT: 35.7 % (ref 35.0–47.0)
HGB: 11.6 g/dL — ABNORMAL LOW (ref 12.0–16.0)
Lymphocyte #: 2.7 10*3/uL (ref 1.0–3.6)
Lymphocyte %: 18.9 %
MCH: 25.4 pg — ABNORMAL LOW (ref 26.0–34.0)
Monocyte %: 8 %
Neutrophil #: 10 10*3/uL — ABNORMAL HIGH (ref 1.4–6.5)
RBC: 4.55 10*6/uL (ref 3.80–5.20)
RDW: 14.7 % — ABNORMAL HIGH (ref 11.5–14.5)
WBC: 14.3 10*3/uL — ABNORMAL HIGH (ref 3.6–11.0)

## 2012-04-01 LAB — COMPREHENSIVE METABOLIC PANEL
Albumin: 3.5 g/dL (ref 3.4–5.0)
Anion Gap: 7 (ref 7–16)
BUN: 13 mg/dL (ref 7–18)
Bilirubin,Total: 0.2 mg/dL (ref 0.2–1.0)
Calcium, Total: 9.5 mg/dL (ref 8.5–10.1)
Chloride: 108 mmol/L — ABNORMAL HIGH (ref 98–107)
Co2: 26 mmol/L (ref 21–32)
Creatinine: 0.73 mg/dL (ref 0.60–1.30)
EGFR (African American): >60
EGFR (Non-African Amer.): >60
Glucose: 101 mg/dL — ABNORMAL HIGH (ref 65–99)
Osmolality: 282 (ref 275–301)
Potassium: 4.3 mmol/L (ref 3.5–5.1)
Sodium: 141 mmol/L (ref 136–145)

## 2012-04-05 ENCOUNTER — Emergency Department (HOSPITAL_COMMUNITY): Payer: Managed Care, Other (non HMO)

## 2012-04-05 ENCOUNTER — Emergency Department (HOSPITAL_COMMUNITY)
Admission: EM | Admit: 2012-04-05 | Discharge: 2012-04-05 | Disposition: A | Discharge status: Home / Self Care | Payer: Managed Care, Other (non HMO) | Attending: Emergency Medicine | Admitting: Emergency Medicine

## 2012-04-05 ENCOUNTER — Encounter (HOSPITAL_COMMUNITY): Payer: Self-pay | Admitting: Emergency Medicine

## 2012-04-05 DIAGNOSIS — Z79899 Other long term (current) drug therapy: Secondary | ICD-10-CM | POA: Insufficient documentation

## 2012-04-05 DIAGNOSIS — J45909 Unspecified asthma, uncomplicated: Secondary | ICD-10-CM | POA: Insufficient documentation

## 2012-04-05 DIAGNOSIS — E119 Type 2 diabetes mellitus without complications: Secondary | ICD-10-CM | POA: Insufficient documentation

## 2012-04-05 DIAGNOSIS — E039 Hypothyroidism, unspecified: Secondary | ICD-10-CM | POA: Insufficient documentation

## 2012-04-05 DIAGNOSIS — Z794 Long term (current) use of insulin: Secondary | ICD-10-CM | POA: Insufficient documentation

## 2012-04-05 DIAGNOSIS — Z9641 Presence of insulin pump (external) (internal): Secondary | ICD-10-CM | POA: Insufficient documentation

## 2012-04-05 DIAGNOSIS — F909 Attention-deficit hyperactivity disorder, unspecified type: Secondary | ICD-10-CM | POA: Insufficient documentation

## 2012-04-05 DIAGNOSIS — R110 Nausea: Secondary | ICD-10-CM | POA: Insufficient documentation

## 2012-04-05 DIAGNOSIS — R109 Unspecified abdominal pain: Secondary | ICD-10-CM | POA: Insufficient documentation

## 2012-04-05 DIAGNOSIS — G8918 Other acute postprocedural pain: Secondary | ICD-10-CM

## 2012-04-05 LAB — COMPREHENSIVE METABOLIC PANEL
Albumin: 3.3 g/dL — ABNORMAL LOW (ref 3.5–5.2)
BUN: 10 mg/dL (ref 6–23)
Creatinine, Ser: 0.49 mg/dL — ABNORMAL LOW (ref 0.50–1.10)
GFR calc Af Amer: >90 mL/min (ref >90)
Glucose, Bld: 93 mg/dL (ref 70–99)
Total Protein: 7.5 g/dL (ref 6.0–8.3)

## 2012-04-05 LAB — CBC WITH DIFFERENTIAL/PLATELET
Basophils Relative: 1 % (ref 0–1)
Eosinophils Absolute: 0.2 10*3/uL (ref 0.0–0.7)
Eosinophils Relative: 1 % (ref 0–5)
HCT: 33.7 % — ABNORMAL LOW (ref 36.0–46.0)
Hemoglobin: 11 g/dL — ABNORMAL LOW (ref 12.0–15.0)
Lymphs Abs: 2.4 10*3/uL (ref 0.7–4.0)
MCH: 25.5 pg — ABNORMAL LOW (ref 26.0–34.0)
MCHC: 32.6 g/dL (ref 30.0–36.0)
MCV: 78.2 fL (ref 78.0–100.0)
Monocytes Absolute: 0.9 10*3/uL (ref 0.1–1.0)
Monocytes Relative: 6 % (ref 3–12)

## 2012-04-05 LAB — GLUCOSE, CAPILLARY: Glucose-Capillary: 91 mg/dL (ref 70–99)

## 2012-04-05 LAB — LIPASE, BLOOD: Lipase: 35 U/L (ref 11–59)

## 2012-04-05 MED ORDER — HYDROMORPHONE HCL PF 1 MG/ML IJ SOLN: 1.0000 mg | Freq: Once | INTRAMUSCULAR | Status: DC

## 2012-04-05 MED ORDER — ONDANSETRON HCL 4 MG/2ML IJ SOLN: 4.0000 mg | Freq: Once | INTRAMUSCULAR | Status: DC

## 2012-04-05 MED ORDER — ONDANSETRON HCL 4 MG/2ML IJ SOLN
4.0000 mg | Freq: Once | INTRAMUSCULAR | Status: AC
  Administered 2012-04-05: 4 mg via INTRAVENOUS
  Filled 2012-04-05: qty 2

## 2012-04-05 MED ORDER — SODIUM CHLORIDE 0.9 % IV BOLUS (SEPSIS)
1000.0000 mL | Freq: Once | INTRAVENOUS | Status: AC
  Administered 2012-04-05: 1000 mL via INTRAVENOUS

## 2012-04-05 NOTE — ED Provider Notes (Signed)
History     CSN: [id number]  Arrival date & time 04/05/12  3086   First MD Initiated Contact with Patient 04/05/12 2233      Chief Complaint  Patient presents with  . Abdominal Pain  . 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 Carlynn Purl.  PMD  Dr. Alonna Buckler at [facility].  Patient states surgical site with increased pain and nausea.  Patient denies fever, vomiting. But states vomited yesterday.  BS was high but states insulin increased.  LMP unknown due to depo last shot one month ago.  Past Medical History  Diagnosis Date  . Diabetes mellitus   . Asthma   . Thyroid disease   . ADHD (attention deficit hyperactivity disorder)   . Hypothyroidism     Past Surgical History  Procedure Date  . Tonsillectomy   . Adnoid   . Splenic cystectomy     No family history on file.  History  Substance Use Topics  . Smoking status: Never Smoker   . Smokeless tobacco: Not on file  . Alcohol Use: Yes    OB History    Grav Para Term Preterm Abortions TAB SAB Ect Mult Living                  Review of Systems  All other systems reviewed and are negative.    Allergies  Amoxicillin; Azithromycin; Barium-containing compounds; Contrast media; Fish allergy; Gelatin; Ibuprofen; Iodine; Latex; Nutritional supplements; Other; Oxycodone; Peanut-containing drug products; Penicillins; Shellfish allergy; and Sulfa antibiotics  Home Medications   Current Outpatient Rx  Name Route Sig Dispense Refill  . ALBUTEROL SULFATE HFA 108 (90 BASE) MCG/ACT IN AERS Inhalation Inhale 2 puffs into the lungs every 6 (six) hours as needed. For wheezing    . CLINDAMYCIN HCL 300 MG PO CAPS Oral Take 300 mg by mouth 3 (three) times daily.    Marland Kitchen EPINEPHRINE 0.3 MG/0.3ML IJ DEVI Intramuscular Inject 0.3 mg into the muscle once.    Marland Kitchen FAMOTIDINE 20 MG PO TABS Oral Take 20 mg by mouth 2 (two) times daily.     Marland Kitchen  FEXOFENADINE HCL 30 MG PO TABS Oral Take 30 mg by mouth 2 (two) times daily.     Marland Kitchen FLUTICASONE-SALMETEROL 250-50 MCG/DOSE IN AEPB Inhalation Inhale 1 puff into the lungs every 12 (twelve) hours.     Marland Kitchen GLUCAGON (RDNA) 1 MG IJ KIT Intravenous Inject 0.25-1 mg into the vein once as needed. Low blood sugar    . HYDROCORTISONE 20 MG PO TABS Oral Take 20 mg by mouth 2 (two) times daily.    Marland Kitchen HYDROCORTISONE SOD SUCCINATE 100 MG IJ SOLR Intravenous Inject 100 mg into the vein once as needed. For adrenal crisis     . INSULIN PUMP Subcutaneous Inject into the skin continuous. Apidra    . LEVOTHYROXINE SODIUM 150 MCG PO TABS Oral Take 150 mcg by mouth daily.    Marland Kitchen LORATADINE 10 MG PO TABS Oral Take 10 mg by mouth daily.     Marland Kitchen MEDROXYPROGESTERONE ACETATE 150 MG/ML IM SUSP Intramuscular Inject 150 mg into the muscle every 3 (three) months.    . METHYLPHENIDATE 30 MG/9HR TD PTCH Transdermal Place 1 patch onto the skin daily. wear patch for 9 hours only each day    . METHYLPHENIDATE HCL 10 MG PO TABS Oral Take 10 mg by mouth daily as needed. If patch wears off....for ADD    . MONTELUKAST SODIUM 10  MG PO TABS Oral Take 10 mg by mouth daily.     . OLOPATADINE HCL 0.1 % OP SOLN Both Eyes Place 1 drop into both eyes 3 (three) times daily.     Marland Kitchen OVER THE COUNTER MEDICATION Oral Take 100 mg by mouth 2 (two) times daily. Benadryl Allergy Quick Dissolve strip, 25mg  per strip      BP 115/79  Pulse 114  Temp 98.7 F (37.1 C) (Oral)  Resp 14  SpO2 99%  Physical Exam  Nursing note and vitals reviewed. Constitutional: She appears well-developed and well-nourished.  HENT:  Head: Normocephalic and atraumatic.  Eyes: Conjunctivae and EOM are normal. Pupils are equal, round, and reactive to light.  Neck: Normal range of motion. Neck supple.  Cardiovascular: Normal rate, regular rhythm, normal heart sounds and intact distal pulses.   Pulmonary/Chest: Effort normal and breath sounds normal.  Abdominal: Soft. Bowel  sounds are normal.  Musculoskeletal: Normal range of motion.  Neurological: She is alert.  Skin: Skin is warm and dry.  Psychiatric: She has a normal mood and affect. Thought content normal.    ED Course  Procedures (including critical care time) Results for orders placed during the hospital encounter of 04/05/12  GLUCOSE, CAPILLARY      Component Value Range   Glucose-Capillary 91  70 - 99 mg/dL   Comment 1 Documented in Chart     Comment 2 Notify RN    CBC WITH DIFFERENTIAL      Component Value Range   WBC 14.7 (*) 4.0 - 10.5 K/uL   RBC 4.31  3.87 - 5.11 MIL/uL   Hemoglobin 11.0 (*) 12.0 - 15.0 g/dL   HCT 16.1 (*) 09.6 - 04.5 %   MCV 78.2  78.0 - 100.0 fL   MCH 25.5 (*) 26.0 - 34.0 pg   MCHC 32.6  30.0 - 36.0 g/dL   RDW 40.9  81.1 - 91.4 %   Platelets 799 (*) 150 - 400 K/uL   Neutrophils Relative 76  43 - 77 %   Neutro Abs 11.2 (*) 1.7 - 7.7 K/uL   Lymphocytes Relative 17  12 - 46 %   Lymphs Abs 2.4  0.7 - 4.0 K/uL   Monocytes Relative 6  3 - 12 %   Monocytes Absolute 0.9  0.1 - 1.0 K/uL   Eosinophils Relative 1  0 - 5 %   Eosinophils Absolute 0.2  0.0 - 0.7 K/uL   Basophils Relative 1  0 - 1 %   Basophils Absolute 0.1  0.0 - 0.1 K/uL  COMPREHENSIVE METABOLIC PANEL      Component Value Range   Sodium 137  135 - 145 mEq/L   Potassium 4.4  3.5 - 5.1 mEq/L   Chloride 103  96 - 112 mEq/L   CO2 21  19 - 32 mEq/L   Glucose, Bld 93  70 - 99 mg/dL   BUN 10  6 - 23 mg/dL   Creatinine, Ser 7.82 (*) 0.50 - 1.10 mg/dL   Calcium 9.5  8.4 - 95.6 mg/dL   Total Protein 7.5  6.0 - 8.3 g/dL   Albumin 3.3 (*) 3.5 - 5.2 g/dL   AST 29  0 - 37 U/L   ALT 59 (*) 0 - 35 U/L   Alkaline Phosphatase 102  39 - 117 U/L   Total Bilirubin 0.1 (*) 0.3 - 1.2 mg/dL   GFR calc non Af Amer >90  >90 mL/min   GFR calc Af Amer >90  >  90 mL/min  LIPASE, BLOOD      Component Value Range   Lipase 35  11 - 59 U/L     Labs Reviewed  URINALYSIS, ROUTINE W REFLEX MICROSCOPIC   No results  found.   No diagnosis found.  Patient with elevated white blood cell count 14,700 CT does not show any acute abnormality. It has some erythema around the site. She is on clindamycin and is advised to continue this. She will follow with her surgeon tomorrow       Hilario Quarry, MD 04/05/12 [phone]

## 2012-04-05 NOTE — ED Notes (Signed)
Patient aware of need for urine specimen. Patient unable to void at this time. Patient given label specimen cup. Encouraged to call for assistance if needed.   

## 2012-04-05 NOTE — ED Notes (Signed)
Pt reports she had splenectomy aug 1st. Currently c/o surgical site pain and nausea x3days.  Incision appears red with surrounding open lesions, no drainage, staples intact.  Pt is afebrile.  Currently taking an antibiotic.  Pain and nausea continues to increase.

## 2012-04-06 LAB — PREGNANCY, URINE: Preg Test, Ur: NEGATIVE

## 2012-04-06 LAB — URINALYSIS, ROUTINE W REFLEX MICROSCOPIC
Leukocytes, UA: NEGATIVE
Protein, ur: NEGATIVE mg/dL
Urobilinogen, UA: 0.2 mg/dL (ref 0.0–1.0)

## 2012-04-26 ENCOUNTER — Emergency Department: Payer: Self-pay | Admitting: Emergency Medicine

## 2012-04-26 LAB — COMPREHENSIVE METABOLIC PANEL
Albumin: 3.5 g/dL (ref 3.4–5.0)
Anion Gap: 10 (ref 7–16)
BUN: 9 mg/dL (ref 7–18)
Chloride: 109 mmol/L — ABNORMAL HIGH (ref 98–107)
Co2: 22 mmol/L (ref 21–32)
Creatinine: 0.77 mg/dL (ref 0.60–1.30)
EGFR (African American): >60
EGFR (Non-African Amer.): >60
Osmolality: 280 (ref 275–301)
Potassium: 3.1 mmol/L — ABNORMAL LOW (ref 3.5–5.1)
SGOT(AST): 20 U/L (ref 15–37)
SGPT (ALT): 32 U/L (ref 12–78)

## 2012-07-31 ENCOUNTER — Emergency Department: Payer: Self-pay | Admitting: Emergency Medicine

## 2012-07-31 LAB — COMPREHENSIVE METABOLIC PANEL
Albumin: 3.7 g/dL (ref 3.4–5.0)
Alkaline Phosphatase: 125 U/L (ref 50–136)
Bilirubin,Total: 0.3 mg/dL (ref 0.2–1.0)
Co2: 23 mmol/L (ref 21–32)
Creatinine: 0.72 mg/dL (ref 0.60–1.30)
EGFR (African American): >60
EGFR (Non-African Amer.): >60
Glucose: 96 mg/dL (ref 65–99)
SGOT(AST): 28 U/L (ref 15–37)
SGPT (ALT): 44 U/L (ref 12–78)
Sodium: 140 mmol/L (ref 136–145)

## 2012-07-31 LAB — CBC WITH DIFFERENTIAL/PLATELET
Basophil #: 0.3 10*3/uL — ABNORMAL HIGH (ref 0.0–0.1)
Eosinophil #: 0 10*3/uL (ref 0.0–0.7)
Eosinophil %: 0 %
Lymphocyte %: 16.1 %
MCH: 24.4 pg — ABNORMAL LOW (ref 26.0–34.0)
MCHC: 31.7 g/dL — ABNORMAL LOW (ref 32.0–36.0)
Monocyte #: 0.9 x10 3/mm (ref 0.2–0.9)
Monocyte %: 4.3 %
Neutrophil #: 15.8 10*3/uL — ABNORMAL HIGH (ref 1.4–6.5)
Neutrophil %: 78.2 %
Platelet: 658 10*3/uL — ABNORMAL HIGH (ref 150–440)
RBC: 4.65 10*6/uL (ref 3.80–5.20)
RDW: 17.9 % — ABNORMAL HIGH (ref 11.5–14.5)

## 2012-08-21 ENCOUNTER — Emergency Department: Payer: Self-pay | Admitting: Emergency Medicine

## 2012-11-18 ENCOUNTER — Emergency Department: Payer: Self-pay | Admitting: Emergency Medicine

## 2012-12-02 ENCOUNTER — Emergency Department: Payer: Self-pay | Admitting: Emergency Medicine

## 2012-12-17 ENCOUNTER — Observation Stay (HOSPITAL_COMMUNITY)
Admission: EM | Admit: 2012-12-17 | Discharge: 2012-12-18 | Disposition: A | Discharge status: Home / Self Care | Payer: Managed Care, Other (non HMO) | Attending: Internal Medicine | Admitting: Internal Medicine

## 2012-12-17 DIAGNOSIS — E2749 Other adrenocortical insufficiency: Secondary | ICD-10-CM | POA: Insufficient documentation

## 2012-12-17 DIAGNOSIS — J45909 Unspecified asthma, uncomplicated: Secondary | ICD-10-CM | POA: Diagnosis present

## 2012-12-17 DIAGNOSIS — T782XXA Anaphylactic shock, unspecified, initial encounter: Principal | ICD-10-CM

## 2012-12-17 DIAGNOSIS — E109 Type 1 diabetes mellitus without complications: Secondary | ICD-10-CM | POA: Diagnosis present

## 2012-12-17 DIAGNOSIS — E2740 Unspecified adrenocortical insufficiency: Secondary | ICD-10-CM

## 2012-12-17 DIAGNOSIS — I498 Other specified cardiac arrhythmias: Secondary | ICD-10-CM | POA: Insufficient documentation

## 2012-12-17 DIAGNOSIS — IMO0002 Reserved for concepts with insufficient information to code with codable children: Secondary | ICD-10-CM | POA: Insufficient documentation

## 2012-12-17 DIAGNOSIS — E039 Hypothyroidism, unspecified: Secondary | ICD-10-CM | POA: Diagnosis present

## 2012-12-17 DIAGNOSIS — I10 Essential (primary) hypertension: Secondary | ICD-10-CM | POA: Insufficient documentation

## 2012-12-17 DIAGNOSIS — Z9641 Presence of insulin pump (external) (internal): Secondary | ICD-10-CM | POA: Insufficient documentation

## 2012-12-17 DIAGNOSIS — F909 Attention-deficit hyperactivity disorder, unspecified type: Secondary | ICD-10-CM

## 2012-12-17 DIAGNOSIS — Z794 Long term (current) use of insulin: Secondary | ICD-10-CM | POA: Insufficient documentation

## 2012-12-17 LAB — CBC WITH DIFFERENTIAL/PLATELET
Basophils Relative: 0 % (ref 0–1)
Eosinophils Absolute: 0.1 10*3/uL (ref 0.0–0.7)
Hemoglobin: 11.3 g/dL — ABNORMAL LOW (ref 12.0–15.0)
MCHC: 33 g/dL (ref 30.0–36.0)
Monocytes Relative: 9 % (ref 3–12)
Neutro Abs: 6.7 10*3/uL (ref 1.7–7.7)
Neutrophils Relative %: 57 % (ref 43–77)
Platelets: 641 10*3/uL — ABNORMAL HIGH (ref 150–400)
RBC: 4.6 MIL/uL (ref 3.87–5.11)

## 2012-12-17 LAB — BASIC METABOLIC PANEL
BUN: 6 mg/dL (ref 6–23)
Chloride: 104 mEq/L (ref 96–112)
GFR calc Af Amer: >90 mL/min (ref >90)
GFR calc non Af Amer: >90 mL/min (ref >90)
Potassium: 3.4 mEq/L — ABNORMAL LOW (ref 3.5–5.1)
Sodium: 141 mEq/L (ref 135–145)

## 2012-12-17 MED ORDER — SODIUM CHLORIDE 0.9 % IV SOLN
INTRAVENOUS | Status: DC
  Administered 2012-12-17: 21:00:00 via INTRAVENOUS
  Administered 2012-12-18: 1000 mL via INTRAVENOUS

## 2012-12-17 MED ORDER — DIPHENHYDRAMINE HCL 50 MG/ML IJ SOLN
50.0000 mg | Freq: Once | INTRAMUSCULAR | Status: AC
  Administered 2012-12-17: 50 mg via INTRAVENOUS
  Filled 2012-12-17: qty 1

## 2012-12-17 MED ORDER — METHYLPREDNISOLONE SODIUM SUCC 125 MG IJ SOLR
125.0000 mg | Freq: Once | INTRAMUSCULAR | Status: AC
  Administered 2012-12-17: 125 mg via INTRAVENOUS
  Filled 2012-12-17: qty 2

## 2012-12-17 MED ORDER — FAMOTIDINE IN NACL 20-0.9 MG/50ML-% IV SOLN
20.0000 mg | Freq: Once | INTRAVENOUS | Status: AC
  Administered 2012-12-17: 20 mg via INTRAVENOUS
  Filled 2012-12-17: qty 50

## 2012-12-17 NOTE — ED Notes (Signed)
IV team at bedside 

## 2012-12-17 NOTE — ED Notes (Signed)
IV team notified x 2

## 2012-12-17 NOTE — ED Notes (Signed)
Patient reports having an allergic reaction to "something." reports having over 40 different allergies and does not know what triggered this event. Patient followed at [facility] for this. Patient self administered epi x 2 PTA. HR 140s and she states that her normal resting HR is in the 120s. No hives noted. Patient reports having an "itchy throat." No other complaints at this time. Patient is AAOx4, resp e/u, NAD noted at this time.

## 2012-12-17 NOTE — ED Notes (Signed)
IV attempt(s) unsuccessful d/t poor venous access. IV team notified

## 2012-12-17 NOTE — ED Provider Notes (Signed)
History     CSN: [id number]  Arrival date & time 12/17/12  4098   First MD Initiated Contact with Patient 12/17/12 1930      Chief Complaint  Patient presents with  . Allergic Reaction    (Consider location/radiation/quality/duration/timing/severity/associated sxs/prior treatment) HPI Comments: Patient comes to the ER for evaluation of allergic reaction. 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 She reports that she had onset of nausea, throat itching and then closing approximately 30 minutes before coming to the ER. She did administer her epinephrine pen x2 and has had some improvement but no complete resolution. Patient denies chest pain. She's not feeling short of breath. She reports that she is followed at [facility] for her allergies. She reports multiple intubations previously. She states that usually she has some improvement with the initial epinephrine, but that has rebound allergy which is worse and this is when she has been intubated in the past. This can take several hours to occur.  Patient is a 22 y.o. female presenting with allergic reaction.  Allergic Reaction The primary symptoms are  nausea. The primary symptoms do not include shortness of breath or rash.    Past Medical History  Diagnosis Date  . Diabetes mellitus   . Asthma   . Thyroid disease   . ADHD (attention deficit hyperactivity disorder)   . Hypothyroidism     Past Surgical History  Procedure Laterality Date  . Tonsillectomy    . Adnoid    . Splenic cystectomy      No family history on file.  History  Substance Use Topics  . Smoking status: Never Smoker   . Smokeless tobacco: Not on file  . Alcohol Use: Yes    OB History   Grav Para Term Preterm Abortions TAB SAB Ect Mult Living                  Review of Systems  HENT: Positive for trouble swallowing.   Respiratory: Negative for shortness of breath.   Gastrointestinal:  Positive for nausea.  Skin: Negative for rash.  All other systems reviewed and are negative.    Allergies  Amoxicillin; Azithromycin; Barium-containing compounds; Contrast media; Fish allergy; Gelatin; Ibuprofen; Iodine; Latex; Nutritional supplements; Other; Oxycodone; Peanut-containing drug products; Penicillins; Shellfish allergy; and Sulfa antibiotics  Home Medications   Current Outpatient Rx  Name  Route  Sig  Dispense  Refill  . albuterol (PROVENTIL HFA;VENTOLIN HFA) 108 (90 BASE) MCG/ACT inhaler   Inhalation   Inhale 2 puffs into the lungs every 6 (six) hours as needed. For wheezing         . clindamycin (CLEOCIN) 300 MG capsule   Oral   Take 300 mg by mouth 3 (three) times daily.         Marland Kitchen EPINEPHrine (EPIPEN) 0.3 mg/0.3 mL DEVI   Intramuscular   Inject 0.3 mg into the muscle once.         . famotidine (PEPCID) 20 MG tablet   Oral   Take 20 mg by mouth 2 (two) times daily.          . fexofenadine (ALLEGRA) 30 MG tablet   Oral   Take 30 mg by mouth 2 (two) times daily.          . Fluticasone-Salmeterol (ADVAIR) 250-50 MCG/DOSE AEPB   Inhalation   Inhale 1 puff into the lungs every 12 (twelve) hours.          Marland Kitchen  glucagon (GLUCAGON EMERGENCY) 1 MG injection   Intravenous   Inject 0.25-1 mg into the vein once as needed. Low blood sugar         . hydrocortisone (CORTEF) 20 MG tablet   Oral   Take 20 mg by mouth 2 (two) times daily.         . hydrocortisone sodium succinate (SOLU-CORTEF) 100 MG injection   Intravenous   Inject 100 mg into the vein once as needed. For adrenal crisis          . Insulin Human (INSULIN PUMP) 100 unit/ml SOLN   Subcutaneous   Inject into the skin continuous. Apidra         . levothyroxine (SYNTHROID, LEVOTHROID) 150 MCG tablet   Oral   Take 150 mcg by mouth daily.         Marland Kitchen loratadine (CLARITIN) 10 MG tablet   Oral   Take 10 mg by mouth daily.          . medroxyPROGESTERone (DEPO-PROVERA) 150 MG/ML  injection   Intramuscular   Inject 150 mg into the muscle every 3 (three) months.         . methylphenidate (DAYTRANA) 30 MG/9HR   Transdermal   Place 1 patch onto the skin daily. wear patch for 9 hours only each day         . methylphenidate (RITALIN) 10 MG tablet   Oral   Take 10 mg by mouth daily as needed. If patch wears off....for ADD         . montelukast (SINGULAIR) 10 MG tablet   Oral   Take 10 mg by mouth daily.          Marland Kitchen olopatadine (PATANOL) 0.1 % ophthalmic solution   Both Eyes   Place 1 drop into both eyes 3 (three) times daily.          Marland Kitchen OVER THE COUNTER MEDICATION   Oral   Take 100 mg by mouth 2 (two) times daily. Benadryl Allergy Quick Dissolve strip, 25mg  per strip           BP 125/91  Pulse 152  Temp(Src) 98.2 F (36.8 C)  Resp 32  SpO2 95%  Physical Exam  Constitutional: She is oriented to person, place, and time. She appears well-developed and well-nourished. No distress.  HENT:  Head: Normocephalic and atraumatic.  Right Ear: Hearing normal.  Nose: Nose normal.  Mouth/Throat: Oropharynx is clear and moist and mucous membranes are normal.  Eyes: Conjunctivae and EOM are normal. Pupils are equal, round, and reactive to light.  Neck: Normal range of motion. Neck supple.  Cardiovascular: Normal rate, regular rhythm, S1 normal and S2 normal.  Exam reveals no gallop and no friction rub.   No murmur heard. Pulmonary/Chest: Effort normal and breath sounds normal. No respiratory distress. She exhibits no tenderness.  Abdominal: Soft. Normal appearance and bowel sounds are normal. There is no hepatosplenomegaly. There is no tenderness. There is no rebound, no guarding, no tenderness at McBurney's point and negative Murphy's sign. No hernia.  Musculoskeletal: Normal range of motion.  Neurological: She is alert and oriented to person, place, and time. She has normal strength. No cranial nerve deficit or sensory deficit. Coordination normal. GCS  eye subscore is 4. GCS verbal subscore is 5. GCS motor subscore is 6.  Skin: Skin is warm, dry and intact. No rash noted. No cyanosis.  Psychiatric: She has a normal mood and affect. Her speech is normal and behavior is  normal. Thought content normal.    ED Course  Procedures (including critical care time)  Labs Reviewed  CBC WITH DIFFERENTIAL - Abnormal; Notable for the following:    WBC 11.8 (*)    Hemoglobin 11.3 (*)    HCT 34.2 (*)    MCV 74.3 (*)    MCH 24.6 (*)    RDW 16.9 (*)    Platelets 641 (*)    All other components within normal limits  BASIC METABOLIC PANEL - Abnormal; Notable for the following:    Potassium 3.4 (*)    Glucose, Bld 110 (*)    All other components within normal limits   No results found.   Diagnosis: Allergic reaction    MDM  Patient comes to the ER for evaluation of acute allergic reaction. Patient reports that she has a history of multiple anaphylactic type reactions in the past and multiple triggers. Patient reports multiple intubations. She did administer epinephrine twice prior to arrival in the ER. She was still experiencing her usual symptoms at arrival. Patientreports blood to flow, filling sensation of her throat closing and nausea as her usual symptoms. She reports that these typically progressed to significant respiratory distress requiring intubation, but that only happens many hours after the initial onset of his symptoms, as a rebound effect after administering epinephrine. Patient has been monitored and has done well here in the ER. Because of her history of delayed anaphylaxis with intubation, I have asked internal medicine to observe her in the hospital.        Gilda Crease, MD 12/17/12 2306

## 2012-12-17 NOTE — H&P (Signed)
Hospital Admission Note Date: 12/17/2012  Patient name: Victoria Sosa Medical record number: [id number] Date of birth: [birth year] Age: 22 y.o. Gender: female PCP: No PCP Per Patient  Medical Service: Internal Medicine Teaching Service  Attending physician: Dr. Rogelia Boga  1st Contact: Dr. Heloise Beecham   Pager:[phone]  2nd Contact: Dr. Manson Passey   Pager:[phone]  After 5 pm or weekends:  1st Contact: Intern on call   Pager: [phone]  2nd Contact: Resident on call  Pager: [phone]    Chief Complaint: Anaphylaxis  History of Present Illness: 22yo F with PMH DMI, hypothyroidism, adrenal insufficiency on chronic steroids, asthma, and anaphylaxis on 100mg  BID of benadryl, 20mg  BID of Pepcid, Allegra 30mg  BID, and Claritin 10mg  daily, presentes to the ED with anaphylaxis; she has a h/o biphasic anaphylaxis, so IMTS was asked to admit for observation.   She states that since 1 AM this morning she's been driving around [place] looking for her friend of hers who was driving drunk and sent out a worrisome text message. She went into a bar looking for friend, and then began to experience a scratchy throat and difficulty breathing secondary to throat swelling. She denies any other edema or hives and itching. She's unsure what she was exposed to that would've caused her throat swelling and itching. She states she got into her car and proceeded to give herself to EpiPen injections, and came to [facility] .  In the ED she received IV Benadryl, IV Pepcid, and Solu-Medrol. Her symptoms began to improve; however she started complaining of double vision, which she says is due to receiving the Benadryl, Pepcid, and IV steroids at the same time. She states that according to her doctor's at [facility] she is supposed to receive these medications over a 5 minute time span.  She has significant allergies to a number of medications, foods, and latex. She states she has an anaphylactic reaction about every 2 weeks. She's been  treated at [facility], and states she is being studied by [facility] because of her severe allergies and history of multiple anaplastic reactions. She also states that she has a history of biphasic anaphylactic reactions, with a second reaction occurring within 6 hours to up to 3 days after the initial reaction. She states she does not always have a second reaction. However the ED physician was uncomfortable with her being discharged home to to the potential for a second anaphylactic reaction.    Meds: Current Outpatient Rx  Name  Route  Sig  Dispense  Refill  . albuterol (PROVENTIL HFA;VENTOLIN HFA) 108 (90 BASE) MCG/ACT inhaler   Inhalation   Inhale 2 puffs into the lungs every 6 (six) hours as needed. For wheezing         . Beclomethasone Dipropionate (QNASL) 80 MCG/ACT AERS   Nasal   Place 1 spray into the nose 2 (two) times daily.         . clindamycin (CLEOCIN) 300 MG capsule   Oral   Take 300 mg by mouth 3 (three) times daily.         . diphenhydrAMINE (BENADRYL) 50 MG tablet   Oral   Take 100 mg by mouth 2 (two) times daily.         Marland Kitchen EPINEPHrine (EPIPEN) 0.3 mg/0.3 mL DEVI   Intramuscular   Inject 0.3 mg into the muscle once.         . famotidine (PEPCID) 20 MG tablet   Oral   Take 20 mg by mouth 2 (two) times  daily.          . fexofenadine (ALLEGRA) 30 MG tablet   Oral   Take 30 mg by mouth 2 (two) times daily.          . Fluticasone-Salmeterol (ADVAIR) 250-50 MCG/DOSE AEPB   Inhalation   Inhale 1 puff into the lungs every 12 (twelve) hours.          Marland Kitchen glucagon (GLUCAGON EMERGENCY) 1 MG injection   Intravenous   Inject 0.25-1 mg into the vein once as needed. Low blood sugar         . hydrocortisone (CORTEF) 20 MG tablet   Oral   Take 20 mg by mouth 2 (two) times daily.         . Insulin Human (INSULIN PUMP) 100 unit/ml SOLN   Subcutaneous   Inject into the skin continuous. Apidra         . levothyroxine (SYNTHROID, LEVOTHROID) 150 MCG tablet    Oral   Take 150 mcg by mouth daily.         Marland Kitchen loratadine (CLARITIN) 10 MG tablet   Oral   Take 10 mg by mouth daily.          . medroxyPROGESTERone (DEPO-PROVERA) 150 MG/ML injection   Intramuscular   Inject 150 mg into the muscle every 3 (three) months.         . methylphenidate (DAYTRANA) 30 MG/9HR   Transdermal   Place 1 patch onto the skin daily. wear patch for 9 hours only each day         . methylphenidate (RITALIN) 10 MG tablet   Oral   Take 10 mg by mouth daily as needed. If patch wears off....for ADD         . montelukast (SINGULAIR) 10 MG tablet   Oral   Take 10 mg by mouth daily.          Marland Kitchen olopatadine (PATANOL) 0.1 % ophthalmic solution   Both Eyes   Place 1 drop into both eyes 3 (three) times daily.          . hydrocortisone sodium succinate (SOLU-CORTEF) 100 MG injection   Intravenous   Inject 100 mg into the vein once as needed. For adrenal crisis            Allergies: Allergies as of 12/17/2012 - Review Complete 12/17/2012  Allergen Reaction Noted  . Amoxicillin Anaphylaxis 07/28/2011  . Azithromycin Anaphylaxis 07/28/2011  . Barium-containing compounds Anaphylaxis 07/28/2011  . Contrast media (iodinated diagnostic agents) Anaphylaxis 01/09/2012  . Fish allergy Anaphylaxis 07/28/2011  . Gelatin Anaphylaxis 07/28/2011  . Ibuprofen Anaphylaxis 07/28/2011  . Iodine Anaphylaxis 07/28/2011  . Latex Anaphylaxis 07/28/2011  . Nutritional supplements Anaphylaxis 07/28/2011  . Other Anaphylaxis 08/31/2011  . Oxycodone Anaphylaxis 07/28/2011  . Peanut-containing drug products Anaphylaxis 07/28/2011  . Penicillins Anaphylaxis 07/28/2011  . Shellfish allergy Anaphylaxis 08/31/2011  . Sulfa antibiotics Anaphylaxis 04/05/2012   Past Medical History  Diagnosis Date  . Diabetes mellitus   . Asthma   . Thyroid disease   . ADHD (attention deficit hyperactivity disorder)   . Hypothyroidism    Past Surgical History  Procedure Laterality Date   . Tonsillectomy    . Adnoid    . Splenic cystectomy     No family history on file. History   Social History  . Marital Status: Single    Spouse Name: N/A    Number of Children: N/A  . Years of Education: N/A   Occupational  History  . Not on file.   Social History Main Topics  . Smoking status: Never Smoker   . Smokeless tobacco: Not on file  . Alcohol Use: Yes  . Drug Use: No  . Sexually Active:    Other Topics Concern  . Not on file   Social History Narrative  . No narrative on file    Review of Systems: A 10 point ROS was performed; pertinent positives and negatives were noted in the HPI   Physical Exam: Blood pressure 121/80, pulse 114, temperature 99.1 F (37.3 C), temperature source Oral, resp. rate 20, SpO2 100.00%. General: obese female who is cooperative on examination.  Head: Normocephalic and atraumatic.  Eyes: Pupils dilated to 4mm, equal, round, and reactive to light, no injection and anicteric.  Mouth: Pharynx pink and moist, no erythema or exudates. No edema. Neck: Supple, full ROM, no thyromegaly or masses, but anterior neck is TTP.  Lungs: CTAB, normal respiratory effort, no accessory muscle use, no crackles, and no wheezes. Heart: Tachycardic, regular rhythm, no murmur, no gallop, and no rub.  Abdomen: Obese, soft, TTP in LUQ, non-distended, hypoactive bowel sounds, no guarding, no rebound tenderness, no organomegaly.  Msk: No joint swelling, warmth, or erythema.  Extremities: 2+ radial and DP pulses bilaterally. No cyanosis, clubbing, edema Neurologic: Alert & oriented X3, cranial nerves II-XII intact, nonfocal Skin: Turgor normal and no rashes.  Psych: Memory intact. She is very concerned about her friend, more so than herself. Speech slurred at times. Voices frustration over having double vision.   Lab results: Basic Metabolic Panel:  Recent Labs  16/10/96 1949  NA 141  K 3.4*  CL 104  CO2 23  GLUCOSE 110*  BUN 6  CREATININE 0.63   CALCIUM 10.0   CBC:  Recent Labs  12/17/12 1949  WBC 11.8*  NEUTROABS 6.7  HGB 11.3*  HCT 34.2*  MCV 74.3*  PLT 641*   Urine Drug Screen: Drugs of Abuse     Component Value Date/Time   LABOPIA NONE DETECTED 01/10/2012 0221   COCAINSCRNUR NONE DETECTED 01/10/2012 0221   LABBENZ NONE DETECTED 01/10/2012 0221   AMPHETMU NONE DETECTED 01/10/2012 0221   THCU NONE DETECTED 01/10/2012 0221   LABBARB NONE DETECTED 01/10/2012 0221     Imaging results:  No results found.  Other results: EKG: None available   Assessment & Plan by Problem: 22yo F with PMH DMI, hypothyroidism, adrenal insufficiency, asthma, and anaphylaxis presents with anaphylaxis; she has a h/o biphasic anaphylaxis, so IMTS was asked to admit for observation.   1. Anaphylaxis: Longstanding h/o of anaphylaxis, and is being [facility] at [facility] and by [facility] per pt. She says she keeps roughly 10 EpiPens at home, and had 2 with her today, which she used when her symptoms began.  Onset of her symptoms was 30 minutes prior to arriving to the ED, and she is unsure of which is exposed to that would cause this reaction. Only after receiving the Solu-Medrol, Pepcid, and Benadryl in addition to the 2 EpiPens she self-administered, did her symptoms resolve. Per ED physician she was about to be discharged when she began complaining of double vision, and noted her history of biphasic anaphylactic reactions, with a second reaction occurring anywhere from 6 hours to 3 days after her first reaction. On exam she is in no distress; however she is still having double vision. She's talking about leaving AMA once her vision clears; we discussed with her that we strongly advise against leaving AMA  as she does not have any more EpiPen's on her person it would be very dangerous for her to be driving and have an anaphylactic reaction. - Admit to IMTS for observation to SDU - Continuous pulse ox - Continue home meds of Pepcid and Benadryl BID, Claritin daily,  Singulair 10mg  daily, and hydrocortisone 20mg  BID - 1mg  IV Epi PRN at the bedside - Full Code  - AM CBC, BMP, LFTs  2. Sinus tachycardia: Per pt, she has a h/o of sinus tachycardia, and was recently admitted to the hospital with a resting heart rate of 180. She states her doctors are unsure as to why she remains tachycardic, but think that possibly due to all the epinephrine that she has received, this has affected her SA node so it no longer regulates itself causing the tachycardia. Her heart rate in the ED was in the 120s, which was also possibly from the 2 shots of epinephrine she received. - tele bed - Vitals per unit routine  3. 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 For her diabetes, she requires an insulin pump, and is followed by [facility]. When she has an anaphylactic reaction, she and her endocrinologist have a "Plan A" and "Plan B" to change her insulin dose due to increased insulin requirements after the epi administration. She increased her pump dose, prior to arrival to the ED, to 45.1u per day per "Plan B" from 18u per day when she first gave herself the EpiPen. She states that sometimes she has hypoglycemia at night, so we will decrease to 38.3u per "Plan A" while in the hospital and adjust as needed. Due to her adrenal insufficiency, she is on chronic daily steroids, hydrocortisone 20mg  po BID. Unfortunately, she has not taken her medication today b/c she has been out looking for her friend. She did receive Solu-medrol 125mg  IV in the ED. Will start her home hydrocortisone.  - Synthroid daily - Decreasing insulin to 38.3u per day per "Plan A" - q2 CBGs - Correction factor: 50 with CBG goal of 120-140 during the day and 130-160 during the night.  - meal coverage:  1 unit for 12 CHO ( 8am-11am); 1 unit for 15 CHO ( 11 am -4 pm); 1 unit  for 18 CHO ( 4 pm -6 am) - Hydrocortisone 20 mg po BID - NS@75cc  - Continuous BP monitoring  4. DVT PPx: SCDs   Dispo: Disposition is deferred at this time, awaiting improvement of current medical problems. Anticipated discharge in approximately 1-2 day(s).   The patient does have a current PCP (No PCP Per Patient), therefore will not be requiring OPC follow-up after discharge.   The patient does not have transportation limitations that hinder transportation to clinic appointments.  Signed: Genelle Gather 12/17/2012, 11:22 PM

## 2012-12-17 NOTE — ED Notes (Signed)
IV RN, Erie Noe, will be down shortly to start IV on patient

## 2012-12-17 NOTE — ED Notes (Addendum)
Throat started itchy and swelling, closing up. Having fever. Feeling nauseas. Took two epi pens.

## 2012-12-18 ENCOUNTER — Encounter (HOSPITAL_COMMUNITY): Payer: Self-pay | Admitting: Family

## 2012-12-18 LAB — HEPATIC FUNCTION PANEL
ALT: 23 U/L (ref 0–35)
Albumin: 3.7 g/dL (ref 3.5–5.2)
Alkaline Phosphatase: 100 U/L (ref 39–117)
Total Bilirubin: 0.2 mg/dL — ABNORMAL LOW (ref 0.3–1.2)

## 2012-12-18 LAB — CBC
HCT: 33.4 % — ABNORMAL LOW (ref 36.0–46.0)
MCH: 25.2 pg — ABNORMAL LOW (ref 26.0–34.0)
MCHC: 33.5 g/dL (ref 30.0–36.0)
MCV: 75.1 fL — ABNORMAL LOW (ref 78.0–100.0)
Platelets: 668 10*3/uL — ABNORMAL HIGH (ref 150–400)
RDW: 17.3 % — ABNORMAL HIGH (ref 11.5–15.5)
WBC: 10.6 10*3/uL — ABNORMAL HIGH (ref 4.0–10.5)

## 2012-12-18 LAB — MRSA PCR SCREENING: MRSA by PCR: NEGATIVE

## 2012-12-18 LAB — BASIC METABOLIC PANEL
BUN: 6 mg/dL (ref 6–23)
CO2: 21 mEq/L (ref 19–32)
Calcium: 9.8 mg/dL (ref 8.4–10.5)
Creatinine, Ser: 0.54 mg/dL (ref 0.50–1.10)

## 2012-12-18 LAB — GLUCOSE, CAPILLARY: Glucose-Capillary: 139 mg/dL — ABNORMAL HIGH (ref 70–99)

## 2012-12-18 MED ORDER — MONTELUKAST SODIUM 10 MG PO TABS
10.0000 mg | ORAL_TABLET | Freq: Every day | ORAL | Status: DC
  Filled 2012-12-18: qty 1

## 2012-12-18 MED ORDER — LORATADINE 10 MG PO TABS
10.0000 mg | ORAL_TABLET | Freq: Every day | ORAL | Status: DC
  Filled 2012-12-18: qty 1

## 2012-12-18 MED ORDER — LEVOTHYROXINE SODIUM 150 MCG PO TABS
150.0000 ug | ORAL_TABLET | Freq: Every day | ORAL | Status: DC
  Administered 2012-12-18: 150 ug via ORAL
  Filled 2012-12-18 (×2): qty 1

## 2012-12-18 MED ORDER — ONDANSETRON 4 MG PO TBDP
4.0000 mg | ORAL_TABLET | Freq: Three times a day (TID) | ORAL | Status: DC | PRN
  Filled 2012-12-18: qty 1

## 2012-12-18 MED ORDER — FLUTICASONE PROPIONATE 50 MCG/ACT NA SUSP
1.0000 | Freq: Two times a day (BID) | NASAL | Status: DC
  Filled 2012-12-18: qty 16

## 2012-12-18 MED ORDER — SODIUM CHLORIDE 0.9 % IJ SOLN: 3.0000 mL | INTRAMUSCULAR | Status: DC | PRN

## 2012-12-18 MED ORDER — EPINEPHRINE 0.3 MG/0.3ML IJ DEVI: 0.3000 mg | INTRAMUSCULAR | Status: DC | PRN

## 2012-12-18 MED ORDER — DEXTROSE 50 % IV SOLN: 1.0000 | INTRAVENOUS | Status: DC | PRN

## 2012-12-18 MED ORDER — EPINEPHRINE 0.3 MG/0.3ML IJ DEVI
0.3000 mg | Freq: Once | INTRAMUSCULAR | Status: DC | PRN
  Filled 2012-12-18: qty 0.6

## 2012-12-18 MED ORDER — OLOPATADINE HCL 0.1 % OP SOLN
1.0000 [drp] | Freq: Three times a day (TID) | OPHTHALMIC | Status: DC
  Filled 2012-12-18: qty 5

## 2012-12-18 MED ORDER — SODIUM CHLORIDE 0.9 % IV SOLN: 250.0000 mL | INTRAVENOUS | Status: DC | PRN

## 2012-12-18 MED ORDER — EPINEPHRINE HCL 1 MG/ML IJ SOLN: 1.0000 mg | INTRAMUSCULAR | Status: DC | PRN

## 2012-12-18 MED ORDER — DIPHENHYDRAMINE HCL 50 MG/ML IJ SOLN
25.0000 mg | Freq: Four times a day (QID) | INTRAMUSCULAR | Status: DC | PRN
  Administered 2012-12-18: 25 mg via INTRAVENOUS
  Filled 2012-12-18: qty 1

## 2012-12-18 MED ORDER — SODIUM CHLORIDE 0.9 % IJ SOLN
3.0000 mL | Freq: Two times a day (BID) | INTRAMUSCULAR | Status: DC
  Administered 2012-12-18: 3 mL via INTRAVENOUS

## 2012-12-18 MED ORDER — DIPHENHYDRAMINE HCL 12.5 MG/5ML PO ELIX
100.0000 mg | ORAL_SOLUTION | Freq: Two times a day (BID) | ORAL | Status: DC
  Filled 2012-12-18 (×2): qty 40

## 2012-12-18 MED ORDER — LORATADINE 10 MG PO TABS: 10.0000 mg | ORAL_TABLET | Freq: Every day | ORAL | Status: DC

## 2012-12-18 MED ORDER — ALBUTEROL SULFATE HFA 108 (90 BASE) MCG/ACT IN AERS: 2.0000 | INHALATION_SPRAY | Freq: Four times a day (QID) | RESPIRATORY_TRACT | Status: DC | PRN

## 2012-12-18 MED ORDER — SODIUM CHLORIDE 0.9 % IV SOLN: INTRAVENOUS | Status: DC

## 2012-12-18 MED ORDER — INSULIN PUMP
SUBCUTANEOUS | Status: DC
  Filled 2012-12-18: qty 1

## 2012-12-18 MED ORDER — HYDROCORTISONE 20 MG PO TABS
20.0000 mg | ORAL_TABLET | Freq: Two times a day (BID) | ORAL | Status: DC
  Administered 2012-12-18 (×2): 20 mg via ORAL
  Filled 2012-12-18 (×4): qty 1

## 2012-12-18 MED ORDER — FAMOTIDINE 20 MG PO TABS
20.0000 mg | ORAL_TABLET | Freq: Two times a day (BID) | ORAL | Status: DC
  Filled 2012-12-18 (×3): qty 1

## 2012-12-18 NOTE — Progress Notes (Signed)
Pt tachycardic at 130, up to 140, Dr. Manson Passey at bedside. He was notified and no new orders were given. He states it is okay for patient to be discharged.

## 2012-12-18 NOTE — Progress Notes (Signed)
Pt discharged - instructions given and pt verbalized understanding, stated she would obtain EpiPen prescription at time of discharge at her pharmacy of choice. Pt refused to use wheelchair. This RN walked with patient to her car, where she was to drive herself to pharmacy and home. IV access was removed prior to discharge. Pt stable at time of discharge.

## 2012-12-18 NOTE — H&P (Signed)
Internal Medicine Teaching Service Attending Note Date: 12/18/2012  Patient name: Victoria Sosa  Medical record number: [id number]  Date of birth: Nov 21, 1990   I have seen and evaluated Victoria Sosa and discussed their care with the Residency Team. Please see Dr Gordy Councilman H&P for full details. [name] is 22 yo receiving care at [facility]. There were no records from [facility] available via [facility]. 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 The second occurrence doesn't always occur but when it does it is from hours to days later and is the one that has resulted in intubation. She had onset at 1 AM on 4/26 of her typical sxs inc scratchy throat, throat swelling, SOB, itching. She does not know what triggered the onset of sxs. She admin 2 epi pens and came to ED. She was given solumedrol, benadryl, and Pepcid and states that the combo all together gives her blurred vision which did occur.   She also is DM and uses an insulin pump. She has worked out settings with her endo in the circumstance that she is tx with solumedrol.   PMHx, meds, allergies, soc hx, fam hx, and ROS were reviewed and pertinent for asthma, ADHD, hypothyroidism, and anaphylaxis.   Physical Exam: Blood pressure 115/56, pulse 136, temperature 98.1 F (36.7 C), temperature source Oral, resp. rate 27, height 5\' 5"  (1.651 m), weight 264 lb 5.3 oz (119.9 kg), SpO2 97.00%. GEN : obese, NAD, resp status stable. Speaking in full sentences HEENT : EOMI, [place] / AT, mucous mem moist H tachy but no MRG LCTAB with GAM ABD + BS, S/NT/ND Ext no edema Skin + tattoos Neuro : no focal, pressured / rapid speech, no mood, affect, decent insight  Lab results: Results for orders placed during the hospital encounter of 12/17/12 (from the past 24 hour(s))  CBC WITH DIFFERENTIAL     Status: Abnormal   Collection Time    12/17/12  7:49 PM      Result Value Range   WBC 11.8 (*) 4.0 - 10.5 K/uL   RBC  4.60  3.87 - 5.11 MIL/uL   Hemoglobin 11.3 (*) 12.0 - 15.0 g/dL   HCT 62.9 (*) 52.8 - 41.3 %   MCV 74.3 (*) 78.0 - 100.0 fL   MCH 24.6 (*) 26.0 - 34.0 pg   MCHC 33.0  30.0 - 36.0 g/dL   RDW 24.4 (*) 01.0 - 27.2 %   Platelets 641 (*) 150 - 400 K/uL   Neutrophils Relative 57  43 - 77 %   Neutro Abs 6.7  1.7 - 7.7 K/uL   Lymphocytes Relative 34  12 - 46 %   Lymphs Abs 4.0  0.7 - 4.0 K/uL   Monocytes Relative 9  3 - 12 %   Monocytes Absolute 1.0  0.1 - 1.0 K/uL   Eosinophils Relative 1  0 - 5 %   Eosinophils Absolute 0.1  0.0 - 0.7 K/uL   Basophils Relative 0  0 - 1 %   Basophils Absolute 0.0  0.0 - 0.1 K/uL  BASIC METABOLIC PANEL     Status: Abnormal   Collection Time    12/17/12  7:49 PM      Result Value Range   Sodium 141  135 - 145 mEq/L   Potassium 3.4 (*) 3.5 - 5.1 mEq/L   Chloride 104  96 - 112 mEq/L   CO2 23  19 - 32 mEq/L   Glucose, Bld  110 (*) 70 - 99 mg/dL   BUN 6  6 - 23 mg/dL   Creatinine, Ser 1.61  0.50 - 1.10 mg/dL   Calcium 09.6  8.4 - 04.5 mg/dL   GFR calc non Af Amer >90  >90 mL/min   GFR calc Af Amer >90  >90 mL/min  HEPATIC FUNCTION PANEL     Status: Abnormal   Collection Time    12/18/12  2:23 AM      Result Value Range   Total Protein 8.8 (*) 6.0 - 8.3 g/dL   Albumin 3.7  3.5 - 5.2 g/dL   AST 15  0 - 37 U/L   ALT 23  0 - 35 U/L   Alkaline Phosphatase 100  39 - 117 U/L   Total Bilirubin 0.2 (*) 0.3 - 1.2 mg/dL   Bilirubin, Direct <4.0  0.0 - 0.3 mg/dL   Indirect Bilirubin NOT CALCULATED  0.3 - 0.9 mg/dL  MRSA PCR SCREENING     Status: None   Collection Time    12/18/12  2:31 AM      Result Value Range   MRSA by PCR NEGATIVE  NEGATIVE  GLUCOSE, CAPILLARY     Status: Abnormal   Collection Time    12/18/12  4:40 AM      Result Value Range   Glucose-Capillary 135 (*) 70 - 99 mg/dL  GLUCOSE, CAPILLARY     Status: Abnormal   Collection Time    12/18/12  6:41 AM      Result Value Range   Glucose-Capillary 139 (*) 70 - 99 mg/dL  BASIC METABOLIC  PANEL     Status: Abnormal   Collection Time    12/18/12  7:18 AM      Result Value Range   Sodium 141  135 - 145 mEq/L   Potassium 3.8  3.5 - 5.1 mEq/L   Chloride 106  96 - 112 mEq/L   CO2 21  19 - 32 mEq/L   Glucose, Bld 146 (*) 70 - 99 mg/dL   BUN 6  6 - 23 mg/dL   Creatinine, Ser 9.81  0.50 - 1.10 mg/dL   Calcium 9.8  8.4 - 19.1 mg/dL   GFR calc non Af Amer >90  >90 mL/min   GFR calc Af Amer >90  >90 mL/min  CBC     Status: Abnormal   Collection Time    12/18/12  7:18 AM      Result Value Range   WBC 10.6 (*) 4.0 - 10.5 K/uL   RBC 4.45  3.87 - 5.11 MIL/uL   Hemoglobin 11.2 (*) 12.0 - 15.0 g/dL   HCT 47.8 (*) 29.5 - 62.1 %   MCV 75.1 (*) 78.0 - 100.0 fL   MCH 25.2 (*) 26.0 - 34.0 pg   MCHC 33.5  30.0 - 36.0 g/dL   RDW 30.8 (*) 65.7 - 84.6 %   Platelets 668 (*) 150 - 400 K/uL  GLUCOSE, CAPILLARY     Status: Abnormal   Collection Time    12/18/12  7:46 AM      Result Value Range   Glucose-Capillary 127 (*) 70 - 99 mg/dL    Assessment and Plan: I agree with the formulated Assessment and Plan with the following changes:   1. Anaphylaxis - pt was appropriately tx with IV steroids, pepcid, and benadryl. Her blurred vision has resolved. Despite the h/o biphasic anaphylaxis, pt wants to leave and understands risks. She will F/U with [facility] doctors although  there are no records from [facility] that I was able to access via [facility].   Burns Spain, MD 4/27/201411:56 AM
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
## 2012-12-18 NOTE — Progress Notes (Signed)
Utilization review completed.  

## 2012-12-19 ENCOUNTER — Emergency Department: Payer: Self-pay | Admitting: Emergency Medicine

## 2013-02-27 ENCOUNTER — Other Ambulatory Visit: Payer: Self-pay | Admitting: Internal Medicine

## 2013-04-03 ENCOUNTER — Other Ambulatory Visit: Payer: Self-pay | Admitting: Internal Medicine

## 2013-05-26 ENCOUNTER — Emergency Department (HOSPITAL_COMMUNITY)
Admission: EM | Admit: 2013-05-26 | Discharge: 2013-05-26 | Disposition: A | Discharge status: Home / Self Care | Payer: Managed Care, Other (non HMO) | Attending: Emergency Medicine | Admitting: Emergency Medicine

## 2013-05-26 ENCOUNTER — Emergency Department (HOSPITAL_COMMUNITY): Payer: Managed Care, Other (non HMO)

## 2013-05-26 ENCOUNTER — Encounter (HOSPITAL_COMMUNITY): Payer: Self-pay | Admitting: Emergency Medicine

## 2013-05-26 DIAGNOSIS — S298XXA Other specified injuries of thorax, initial encounter: Secondary | ICD-10-CM | POA: Insufficient documentation

## 2013-05-26 DIAGNOSIS — Y92410 Unspecified street and highway as the place of occurrence of the external cause: Secondary | ICD-10-CM | POA: Insufficient documentation

## 2013-05-26 DIAGNOSIS — Z91040 Latex allergy status: Secondary | ICD-10-CM | POA: Insufficient documentation

## 2013-05-26 DIAGNOSIS — F909 Attention-deficit hyperactivity disorder, unspecified type: Secondary | ICD-10-CM | POA: Insufficient documentation

## 2013-05-26 DIAGNOSIS — R209 Unspecified disturbances of skin sensation: Secondary | ICD-10-CM | POA: Insufficient documentation

## 2013-05-26 DIAGNOSIS — M62838 Other muscle spasm: Secondary | ICD-10-CM

## 2013-05-26 DIAGNOSIS — J45909 Unspecified asthma, uncomplicated: Secondary | ICD-10-CM | POA: Insufficient documentation

## 2013-05-26 DIAGNOSIS — Z792 Long term (current) use of antibiotics: Secondary | ICD-10-CM | POA: Insufficient documentation

## 2013-05-26 DIAGNOSIS — E119 Type 2 diabetes mellitus without complications: Secondary | ICD-10-CM | POA: Insufficient documentation

## 2013-05-26 DIAGNOSIS — Z880 Allergy status to penicillin: Secondary | ICD-10-CM | POA: Insufficient documentation

## 2013-05-26 DIAGNOSIS — Y9389 Activity, other specified: Secondary | ICD-10-CM | POA: Insufficient documentation

## 2013-05-26 DIAGNOSIS — Z79899 Other long term (current) drug therapy: Secondary | ICD-10-CM | POA: Insufficient documentation

## 2013-05-26 DIAGNOSIS — E039 Hypothyroidism, unspecified: Secondary | ICD-10-CM | POA: Insufficient documentation

## 2013-05-26 DIAGNOSIS — Z794 Long term (current) use of insulin: Secondary | ICD-10-CM | POA: Insufficient documentation

## 2013-05-26 DIAGNOSIS — IMO0002 Reserved for concepts with insufficient information to code with codable children: Secondary | ICD-10-CM | POA: Insufficient documentation

## 2013-05-26 HISTORY — DX: Tachycardia, unspecified: R00.0

## 2013-05-26 MED ORDER — DIAZEPAM 5 MG/ML PO CONC: 5.0000 mg | Freq: Three times a day (TID) | ORAL | Status: DC | PRN
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
## 2013-05-26 NOTE — ED Notes (Signed)
Pt refused icepack.

## 2013-05-27 NOTE — ED Provider Notes (Signed)
Medical screening examination/treatment/procedure(s) were performed by non-physician practitioner and as supervising physician I was immediately available for consultation/collaboration.  Arlenis Blaydes, MD 05/27/13 0214 

## 2013-05-30 ENCOUNTER — Emergency Department (HOSPITAL_COMMUNITY)
Admission: EM | Admit: 2013-05-30 | Discharge: 2013-05-31 | Disposition: A | Discharge status: Other Acute Inpatient Hospital | Payer: Managed Care, Other (non HMO) | Attending: Emergency Medicine | Admitting: Emergency Medicine

## 2013-05-30 ENCOUNTER — Encounter (HOSPITAL_COMMUNITY): Payer: Self-pay | Admitting: Nurse Practitioner

## 2013-05-30 DIAGNOSIS — Z79899 Other long term (current) drug therapy: Secondary | ICD-10-CM | POA: Insufficient documentation

## 2013-05-30 DIAGNOSIS — R000 Tachycardia, unspecified: Secondary | ICD-10-CM | POA: Insufficient documentation

## 2013-05-30 DIAGNOSIS — E162 Hypoglycemia, unspecified: Secondary | ICD-10-CM

## 2013-05-30 DIAGNOSIS — IMO0002 Reserved for concepts with insufficient information to code with codable children: Secondary | ICD-10-CM | POA: Insufficient documentation

## 2013-05-30 DIAGNOSIS — E669 Obesity, unspecified: Secondary | ICD-10-CM | POA: Insufficient documentation

## 2013-05-30 DIAGNOSIS — Z880 Allergy status to penicillin: Secondary | ICD-10-CM | POA: Insufficient documentation

## 2013-05-30 DIAGNOSIS — E1169 Type 2 diabetes mellitus with other specified complication: Secondary | ICD-10-CM | POA: Insufficient documentation

## 2013-05-30 DIAGNOSIS — J45909 Unspecified asthma, uncomplicated: Secondary | ICD-10-CM | POA: Insufficient documentation

## 2013-05-30 DIAGNOSIS — F909 Attention-deficit hyperactivity disorder, unspecified type: Secondary | ICD-10-CM | POA: Insufficient documentation

## 2013-05-30 DIAGNOSIS — Z91040 Latex allergy status: Secondary | ICD-10-CM | POA: Insufficient documentation

## 2013-05-30 DIAGNOSIS — G909 Disorder of the autonomic nervous system, unspecified: Secondary | ICD-10-CM | POA: Insufficient documentation

## 2013-05-30 DIAGNOSIS — E039 Hypothyroidism, unspecified: Secondary | ICD-10-CM | POA: Insufficient documentation

## 2013-05-30 DIAGNOSIS — Z794 Long term (current) use of insulin: Secondary | ICD-10-CM | POA: Insufficient documentation

## 2013-05-30 HISTORY — DX: Familial dysautonomia (riley-day): G90.1

## 2013-05-30 LAB — CBC
HCT: 36.6 % (ref 36.0–46.0)
Hemoglobin: 12.7 g/dL (ref 12.0–15.0)
MCH: 27.1 pg (ref 26.0–34.0)
MCV: 78.2 fL (ref 78.0–100.0)
RBC: 4.68 MIL/uL (ref 3.87–5.11)

## 2013-05-30 LAB — BASIC METABOLIC PANEL
BUN: 10 mg/dL (ref 6–23)
CO2: 22 mEq/L (ref 19–32)
Calcium: 10 mg/dL (ref 8.4–10.5)
Creatinine, Ser: 0.62 mg/dL (ref 0.50–1.10)
Glucose, Bld: 103 mg/dL — ABNORMAL HIGH (ref 70–99)

## 2013-05-30 LAB — GLUCOSE, CAPILLARY: Glucose-Capillary: 63 mg/dL — ABNORMAL LOW (ref 70–99)

## 2013-05-30 MED ORDER — DEXTROSE 5 % IV SOLN: Freq: Once | INTRAVENOUS | Status: DC

## 2013-05-30 NOTE — ED Notes (Signed)
Ekg shown Dr. Hyacinth Meeker copies in the chart

## 2013-05-30 NOTE — ED Notes (Addendum)
Pts doctors at [facility] have recently started her on beta blockers for dysautonomia and tachycardia but she is also diabetic and today her blood sugar dropped from 250 to 55. Reports she has been having trouble with her glucose since she was started on beta blockers. She had appt at [facility] today but she was worried to to drive there due to hypoglycemia

## 2013-05-30 NOTE — ED Provider Notes (Signed)
CSN: [id number]     Arrival date & time 05/30/13  1639 History   First MD Initiated Contact with Patient 05/30/13 1801     Chief Complaint  Patient presents with  . Hypoglycemia   (Consider location/radiation/quality/duration/timing/severity/associated sxs/prior Treatment) Patient is a 22 y.o. female presenting with hypoglycemia. The history is provided by the patient.  Hypoglycemia Associated symptoms: no shortness of breath and no vomiting    patient has chronic dysfunction and tachycardia. She normally did seen at [facility]. She recently was started on propranolol after her Coreg was not working. She also said to allergic reactions in his head Solu-Cortef twice, once yesterday once today. She states her sugar decreased to 55 while driving to [facility]. She was worried about dropping lower. She is on beta-blockade for her chronic tachycardia. She states she started propranolol yesterday. She states it is made her heart rate was faster. She states beta blockers make her heart goes faster. She states her head feels foggy. She has a insulin pump and monitor. Her sugar has decreased down to 60 while in the ED. She states she ate some glucose tabs. She states she's been unable to eat more food she said she had it once and which today and has chronic nausea. She has had steroids today and states that she could not go up to 300 as he usually does when she gets her steroids.  Past Medical History  Diagnosis Date  . Diabetes mellitus   . Asthma   . Thyroid disease   . ADHD (attention deficit hyperactivity disorder)   . Hypothyroidism   . Tachycardia   . Dysautonomia    Past Surgical History  Procedure Laterality Date  . Tonsillectomy    . Adnoid    . Splenic cystectomy    . Splenectomy     History reviewed. No pertinent family history. History  Substance Use Topics  . Smoking status: Never Smoker   . Smokeless tobacco: Not on file  . Alcohol Use: No   OB History   Grav Para Term Preterm  Abortions TAB SAB Ect Mult Living                 Review of Systems  Constitutional: Negative for activity change and appetite change.  HENT: Negative for neck stiffness.   Eyes: Negative for pain.  Respiratory: Negative for chest tightness and shortness of breath.   Cardiovascular: Negative for chest pain and leg swelling.       Tachycardia  Gastrointestinal: Negative for nausea, vomiting, abdominal pain and diarrhea.  Genitourinary: Negative for flank pain.  Musculoskeletal: Negative for back pain.  Skin: Negative for rash.  Neurological: Positive for light-headedness. Negative for weakness, numbness and headaches.  Psychiatric/Behavioral: Negative for behavioral problems.    Allergies  Amoxicillin; Azithromycin; Barium-containing compounds; Contrast media; Fish allergy; Gelatin; Ibuprofen; Iodine; Latex; Nutritional supplements; Other; Oxycodone; Peanut-containing drug products; Penicillins; Shellfish allergy; and Sulfa antibiotics  Home Medications   Current Outpatient Rx  Name  Route  Sig  Dispense  Refill  . albuterol (PROVENTIL HFA;VENTOLIN HFA) 108 (90 BASE) MCG/ACT inhaler   Inhalation   Inhale 2 puffs into the lungs every 6 (six) hours as needed for wheezing or shortness of breath.          . Beclomethasone Dipropionate (QNASL) 80 MCG/ACT AERS   Nasal   Place 1 spray into the nose 2 (two) times daily.         . carvedilol (COREG) 3.125 MG tablet  Oral   Take 3.125 mg by mouth 2 (two) times daily with a meal.         . diazepam (VALIUM) 5 MG/ML solution   Oral   Take 5 mg by mouth every 8 (eight) hours as needed (anxiety).         Marland Kitchen diphenhydrAMINE (BENADRYL) 50 MG tablet   Oral   Take 100 mg by mouth 2 (two) times daily.         Marland Kitchen EPINEPHrine (EPIPEN) 0.3 mg/0.3 mL SOAJ injection   Intramuscular   Inject 0.3 mg into the muscle daily as needed (Anaphylaxis).         . famotidine (PEPCID) 20 MG tablet   Oral   Take 20 mg by mouth 2 (two) times  daily.          . fexofenadine (ALLEGRA) 30 MG tablet   Oral   Take 30 mg by mouth 2 (two) times daily.          . Fluticasone-Salmeterol (ADVAIR) 250-50 MCG/DOSE AEPB   Inhalation   Inhale 1 puff into the lungs every 12 (twelve) hours.          Marland Kitchen glucagon (GLUCAGON EMERGENCY) 1 MG injection   Intravenous   Inject 0.25-1 mg into the vein once as needed. Low blood sugar         . hydrocortisone (CORTEF) 20 MG tablet   Oral   Take 20 mg by mouth 2 (two) times daily.         . hydrocortisone sodium succinate (SOLU-CORTEF) 100 MG injection   Intravenous   Inject 100 mg into the vein once as needed. For adrenal crisis          . Insulin Human (INSULIN PUMP) 100 unit/ml SOLN   Subcutaneous   Inject into the skin continuous. Apidra         . levothyroxine (SYNTHROID, LEVOTHROID) 150 MCG tablet   Oral   Take 150 mcg by mouth daily.         Marland Kitchen loratadine (CLARITIN) 10 MG tablet   Oral   Take 10 mg by mouth daily.          . medroxyPROGESTERone (DEPO-PROVERA) 150 MG/ML injection   Intramuscular   Inject 150 mg into the muscle every 3 (three) months.         . methylphenidate (DAYTRANA) 30 MG/9HR   Transdermal   Place 1 patch onto the skin daily. wear patch for 9 hours only each day         . methylphenidate (RITALIN) 10 MG tablet   Oral   Take 10 mg by mouth daily as needed. If patch wears off....for ADD         . montelukast (SINGULAIR) 10 MG tablet   Oral   Take 10 mg by mouth daily.          Marland Kitchen olopatadine (PATANOL) 0.1 % ophthalmic solution   Both Eyes   Place 1 drop into both eyes 3 (three) times daily.          . propranolol (INDERAL) 20 MG/5ML solution   Oral   Take 10 mg by mouth 3 (three) times daily.          BP 149/96  Pulse 121  Temp(Src) 98.1 F (36.7 C) (Oral)  Resp 17  SpO2 100% Physical Exam  Nursing note and vitals reviewed. Constitutional: She is oriented to person, place, and time. She appears well-developed and  well-nourished.  Patient is  obese.  HENT:  Head: Normocephalic and atraumatic.  Eyes: EOM are normal. Pupils are equal, round, and reactive to light.  Neck: Normal range of motion. Neck supple.  Cardiovascular: Regular rhythm and normal heart sounds.   No murmur heard. Tachycardia  Pulmonary/Chest: Effort normal and breath sounds normal. No respiratory distress. She has no wheezes. She has no rales.  Cardiac monitoring in place.  Abdominal: Soft. Bowel sounds are normal. She exhibits no distension. There is no tenderness. There is no rebound and no guarding.  Musculoskeletal: Normal range of motion.  Neurological: She is alert and oriented to person, place, and time. No cranial nerve deficit.  Skin: Skin is warm and dry.  Psychiatric: She has a normal mood and affect. Her speech is normal.    ED Course  Procedures (including critical care time) Labs Review Labs Reviewed  CBC - Abnormal; Notable for the following:    WBC 11.6 (*)    RDW 17.0 (*)    Platelets 718 (*)    All other components within normal limits  BASIC METABOLIC PANEL - Abnormal; Notable for the following:    Glucose, Bld 103 (*)    All other components within normal limits  GLUCOSE, CAPILLARY - Abnormal; Notable for the following:    Glucose-Capillary 64 (*)    All other components within normal limits  GLUCOSE, CAPILLARY - Abnormal; Notable for the following:    Glucose-Capillary 63 (*)    All other components within normal limits  GLUCOSE, CAPILLARY  POCT I-STAT TROPONIN I   Imaging Review No results found.  Date: 05/30/2013  Rate: 139  Rhythm: sinus tachycardia  QRS Axis: normal  Intervals: normal  ST/T Wave abnormalities: normal  Conduction Disutrbances:none  Narrative Interpretation: rate increased  Old EKG Reviewed: changes noted   MDM   1. Tachycardia   2. Hypoglycemia    Patient with tachycardia and hypoglycemia. May be due to autonomic dysfunction. Has required IV dextrose here. Have  continued patient's insulin pump. Discussed with doctors at [facility]. She will be transferred for further care with those familiar with the patient.    Juliet Rude. Rubin Payor, MD 05/30/13 [phone]

## 2013-06-29 ENCOUNTER — Other Ambulatory Visit: Payer: Self-pay

## 2013-10-15 ENCOUNTER — Emergency Department: Payer: Self-pay | Admitting: Internal Medicine

## 2013-10-19 ENCOUNTER — Emergency Department: Payer: Self-pay | Admitting: Emergency Medicine

## 2013-10-19 LAB — CBC WITH DIFFERENTIAL/PLATELET
Basophil #: 0 10*3/uL (ref 0.0–0.1)
Basophil %: 0.2 %
Eosinophil #: 0.3 10*3/uL (ref 0.0–0.7)
Eosinophil %: 1.8 %
Eosinophil: 2 %
HCT: 38.7 % (ref 35.0–47.0)
HGB: 12.8 g/dL (ref 12.0–16.0)
LYMPHS PCT: 20 %
Lymphocyte #: 2.9 10*3/uL (ref 1.0–3.6)
Lymphocyte %: 20.1 %
MCH: 25.8 pg — AB (ref 26.0–34.0)
MCHC: 33 g/dL (ref 32.0–36.0)
MCV: 78 fL — AB (ref 80–100)
MONOS PCT: 7 %
Monocyte #: 0.9 x10 3/mm (ref 0.2–0.9)
Monocyte %: 6.2 %
Neutrophil #: 10.4 10*3/uL — ABNORMAL HIGH (ref 1.4–6.5)
Neutrophil %: 71.7 %
Platelet: 607 10*3/uL — ABNORMAL HIGH (ref 150–440)
RBC: 4.95 10*6/uL (ref 3.80–5.20)
RDW: 16.9 % — ABNORMAL HIGH (ref 11.5–14.5)
Segmented Neutrophils: 71 %
WBC: 14.4 10*3/uL — AB (ref 3.6–11.0)

## 2013-10-30 ENCOUNTER — Emergency Department: Payer: Self-pay | Admitting: Emergency Medicine

## 2013-11-15 ENCOUNTER — Encounter: Payer: Self-pay | Admitting: Internal Medicine

## 2013-11-15 ENCOUNTER — Ambulatory Visit: Payer: Managed Care, Other (non HMO) | Admitting: Internal Medicine

## 2013-11-21 ENCOUNTER — Ambulatory Visit (INDEPENDENT_AMBULATORY_CARE_PROVIDER_SITE_OTHER): Payer: Managed Care, Other (non HMO) | Admitting: Internal Medicine

## 2013-11-21 ENCOUNTER — Encounter: Payer: Self-pay | Admitting: Internal Medicine

## 2013-11-21 ENCOUNTER — Encounter: Payer: Self-pay | Admitting: *Deleted

## 2013-11-21 VITALS — BP 127/82 | HR 128 | Ht 65.0 in | Wt 291.0 lb

## 2013-11-21 DIAGNOSIS — G901 Familial dysautonomia [Riley-Day]: Secondary | ICD-10-CM

## 2013-11-21 DIAGNOSIS — G909 Disorder of the autonomic nervous system, unspecified: Secondary | ICD-10-CM

## 2013-11-21 NOTE — Patient Instructions (Signed)
Your physician recommends that you continue on your current medications as directed. Please refer to the Current Medication list given to you today.  No follow up is needed at this time with Dr. Klein.  

## 2013-11-21 NOTE — Progress Notes (Signed)
ELECTROPHYSIOLOGY CONSULT NOTE  Patient ID: Victoria Sosa, MRN: [id number], DOB/AGE: July 16, 1991 23 y.o. Admit date: (Not on file) Date of Consult: 05/22/2014  Primary Physician: Pcp Not In System Primary Cardiologist:  new  Chief Complaint: speell   HPI Victoria Sosa is a 23 y.o. female  With multilple complaints seen by other mds in the past  I dont remembeer the details and my notes are lost THe assessment and plan wsa outlined and we will not be able to bill for this viist    Past Medical History  Diagnosis Date  . Diabetes mellitus   . Asthma   . Thyroid disease   . ADHD (attention deficit hyperactivity disorder)   . Hypothyroidism   . Tachycardia   . Dysautonomia   . Adrenal insufficiency       Surgical History:  Past Surgical History  Procedure Laterality Date  . Tonsillectomy    . Adnoid    . Splenic cystectomy    . Splenectomy       Home Meds: Prior to Admission medications   Medication Sig Start Date End Date Taking? Authorizing Provider  albuterol (PROVENTIL HFA;VENTOLIN HFA) 108 (90 BASE) MCG/ACT inhaler Inhale 2 puffs into the lungs every 6 (six) hours as needed for wheezing or shortness of breath.     Historical Provider, MD  Beclomethasone Dipropionate (QNASL) 80 MCG/ACT AERS Place 1 spray into the nose 2 (two) times daily.    Historical Provider, MD  carvedilol (COREG) 3.125 MG tablet Take 3.125 mg by mouth 2 (two) times daily with a meal.    Historical Provider, MD  diazepam (VALIUM) 5 MG/ML solution Take 5 mg by mouth every 8 (eight) hours as needed (anxiety).    Historical Provider, MD  diphenhydrAMINE (BENADRYL) 50 MG tablet Take 100 mg by mouth 2 (two) times daily.    Historical Provider, MD  EPINEPHrine (EPIPEN) 0.3 mg/0.3 mL SOAJ injection Inject 0.3 mg into the muscle daily as needed (Anaphylaxis).    Historical Provider, MD  famotidine (PEPCID) 20 MG tablet Take 20 mg by mouth 2 (two) times daily.     Historical Provider, MD    fexofenadine (ALLEGRA) 30 MG tablet Take 30 mg by mouth 2 (two) times daily.     Historical Provider, MD  Fluticasone-Salmeterol (ADVAIR) 250-50 MCG/DOSE AEPB Inhale 1 puff into the lungs every 12 (twelve) hours.     Historical Provider, MD  glucagon (GLUCAGON EMERGENCY) 1 MG injection Inject 0.25-1 mg into the vein once as needed. Low blood sugar    Historical Provider, MD  hydrocortisone (CORTEF) 20 MG tablet Take 20 mg by mouth 2 (two) times daily.    Historical Provider, MD  hydrocortisone sodium succinate (SOLU-CORTEF) 100 MG injection Inject 100 mg into the vein once as needed. For adrenal crisis     Historical Provider, MD  Insulin Human (INSULIN PUMP) 100 unit/ml SOLN Inject into the skin continuous. Apidra    Historical Provider, MD  levothyroxine (SYNTHROID, LEVOTHROID) 150 MCG tablet Take 150 mcg by mouth daily.    Historical Provider, MD  loratadine (CLARITIN) 10 MG tablet Take 10 mg by mouth daily.     Historical Provider, MD  medroxyPROGESTERone (DEPO-PROVERA) 150 MG/ML injection Inject 150 mg into the muscle every 3 (three) months.    Historical Provider, MD  methylphenidate [facility](DAYTRANA) 30 MG/9HR Place 1 patch onto the skin daily. wear patch for 9 hours only each day    Historical Provider, MD  methylphenidate (RITALIN) 10 MG tablet Take  10 mg by mouth daily as needed. If patch wears off....for ADD    Historical Provider, MD  montelukast (SINGULAIR) 10 MG tablet Take 10 mg by mouth daily.     Historical Provider, MD  olopatadine (PATANOL) 0.1 % ophthalmic solution Place 1 drop into both eyes 3 (three) times daily.     Historical Provider, MD  propranolol (INDERAL) 20 MG/5ML solution Take 10 mg by mouth 3 (three) times daily. 05/26/13 05/26/14  Historical Provider, MD     Allergies:  Allergies  Allergen Reactions  . Acetaminophen Anaphylaxis  . Amoxicillin Anaphylaxis  . Aspirin Anaphylaxis, Hives and Itching    Feels throat itching She reportedly had anaphylactic reaction to  ASA  . Azithromycin Anaphylaxis  . Barium Iodide Anaphylaxis  . Barium-Containing Compounds Anaphylaxis  . Bee Venom Anaphylaxis  . Contrast Media [Iodinated Diagnostic Agents] Anaphylaxis  . Erythromycin Anaphylaxis  . Fish Allergy Anaphylaxis  . Gelatin Anaphylaxis    Only gelatin in a vaccine, can eat with no problem  . Haemophilus Influenza Vaccines Anaphylaxis  . Ibuprofen Anaphylaxis  . Influenza Vaccines Anaphylaxis  . Insulins Anaphylaxis    Humalog, Lantus, levemir  -  Latex in rubber stopper  . Iodine Anaphylaxis  . Labetalol Other (See Comments)    Hair loss  . Latex Anaphylaxis  . Meningococcal Vaccines Anaphylaxis  . Nutritional Supplements Anaphylaxis  . Other Anaphylaxis and Rash    Uncoded Allergy. Allergen: Plants/trees/grasses/molds/etc, Other Reaction: itchy (mild per pt) Uncoded Allergy. Allergen: nuts Uncoded Allergy. Allergen: Cleaning chemicals Uncoded Allergy. Allergen: ALL VACCINES ALL Nuts  Raw fruits and veggies Stinging insects  ALL VACCINES  . Oxycodone Anaphylaxis  . Peanut-Containing Drug Products Anaphylaxis  . Penicillins Anaphylaxis  . Pneumococcal Vac Polyvalent Anaphylaxis  . Rabies Vaccines Anaphylaxis  . Shellfish Allergy Anaphylaxis  . Sulfa Antibiotics Anaphylaxis  . Valium [Diazepam]     unknown  . Vegetable Enzyme Anaphylaxis  . Etodolac   . Insulin Glargine Hives and Rash    History   Social History  . Marital Status: Single    Spouse Name: N/A    Number of Children: N/A  . Years of Education: N/A   Occupational History  . Not on file.   Social History Main Topics  . Smoking status: Never Smoker   . Smokeless tobacco: Not on file  . Alcohol Use: No  . Drug Use: No  . Sexual Activity: Not on file   Other Topics Concern  . Not on file   Social History Narrative  . No narrative on file     Family History  Problem Relation Age of Onset  . Adopted: Yes     ROS:  Please see the history of present illness.      All other systems reviewed and negative.    Physical Exam: Blood pressure 127/82, pulse 128, height 5\' 5"  (1.651 m), weight 291 lb (131.997 kg). General: Well developed, well nourished female in no acute distress. Head: Normocephalic, atraumatic, sclera non-icteric, no xanthomas, nares are without discharge. EENT: normal Lymph Nodes:  none Back: without scoliosis/kyphosis, no CVA tendersness Neck: Negative for carotid bruits. JVD not elevated. Lungs: Clear bilaterally to auscultation without wheezes, rales, or rhonchi. Breathing is unlabored. Heart: RRR with S1 S2. Nmurmur , rubs, or gallops appreciated. Abdomen: Soft, non-tender, non-distended with normoactive bowel sounds. No hepatomegaly. No rebound/guarding. No obvious abdominal masses. Msk:  Strength and tone appear normal for age. Extremities: No clubbing or cyanosis. No edema.  Distal pedal pulses  are 2+ and equal bilaterally. Skin: Warm and Dry Neuro: Alert and oriented X 3. CN III-XII intact Grossly normal sensory and motor function . Psych:  Responds to questions appropriately with a normal affect.      Labs: Cardiac Enzymes No results found for this basename: CKTOTAL, CKMB, TROPONINI,  in the last 72 hours CBC Lab Results  Component Value Date   WBC 11.6* 05/30/2013   HGB 12.7 05/30/2013   HCT 36.6 05/30/2013   MCV 78.2 05/30/2013   PLT 718* 05/30/2013   PROTIME: No results found for this basename: LABPROT, INR,  in the last 72 hours Chemistry No results found for this basename: NA, K, CL, CO2, BUN, CREATININE, CALCIUM, LABALBU, PROT, BILITOT, ALKPHOS, ALT, AST, GLUCOSE,  in the last 168 hours Lipids No results found for this basename: CHOL,  HDL,  LDLCALC,  TRIG   BNP No results found for this basename: probnp   Miscellaneous No results found for this basename: DDIMER    Radiology/Studies:  No results found.  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 Holter monitoring data would be expected to show Diurnal  variation .  Interestingly ite has been responsive to volume loading consistent with an autonomic reflex. Notably, today during Valsalva testing her phase for reactions bradycardia instead of tachycardia      Sherryl Manges

## 2013-12-12 IMAGING — CR DG CERVICAL SPINE COMPLETE 4+V
5 series · 5 of 5 positions shown · non-contrast
Comparison: None.

CLINICAL DATA: Neck pain

EXAM:
CERVICAL SPINE  4+ VIEWS

[w c-spine lat]
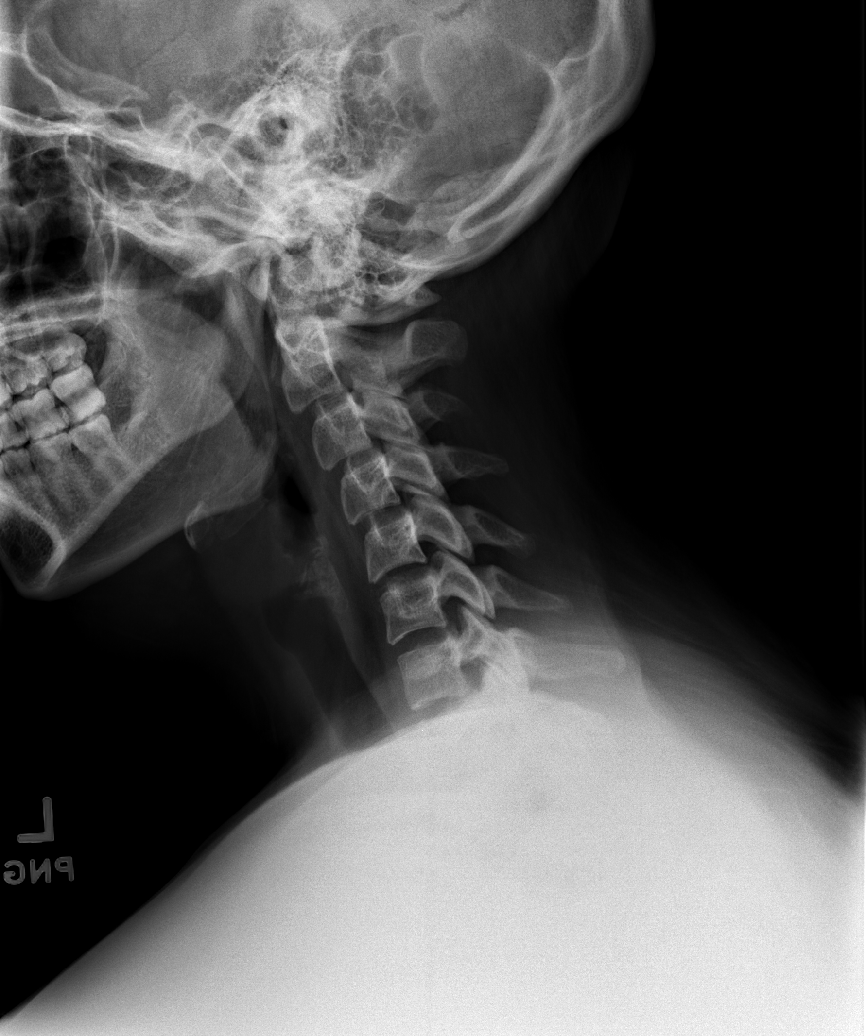

[w swimmers view]
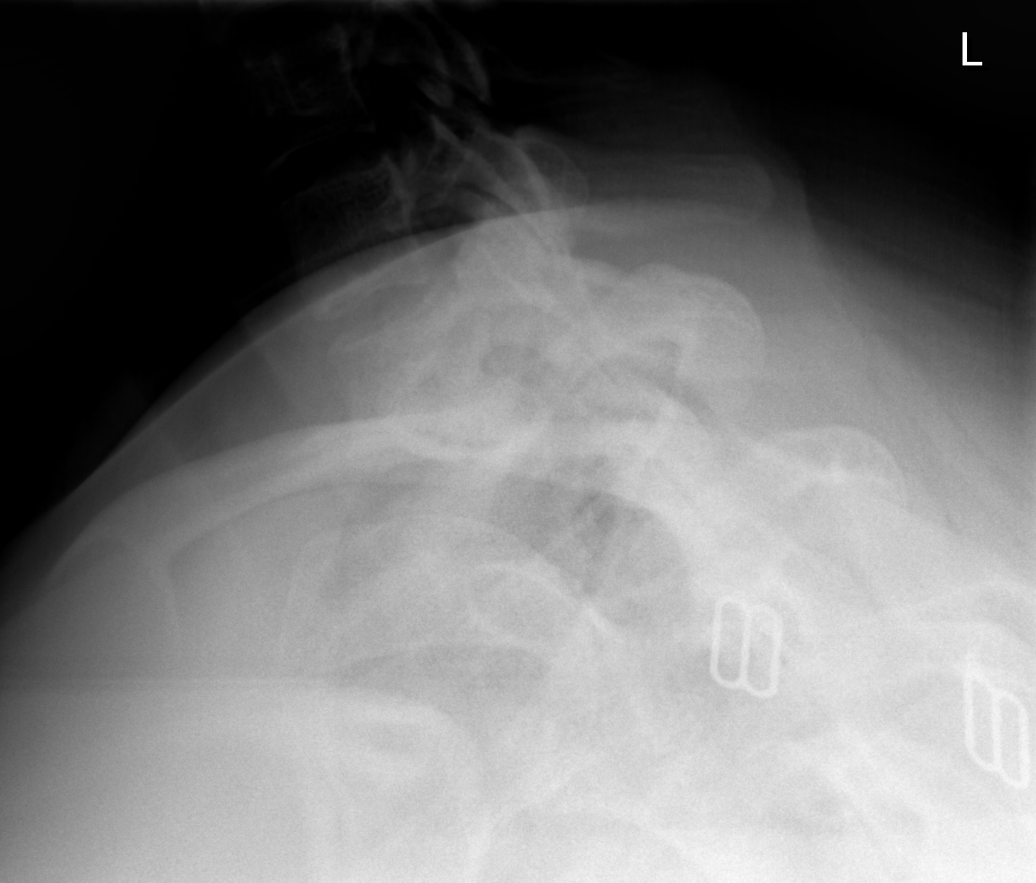

[w c-spine oblique (1 of 2)]
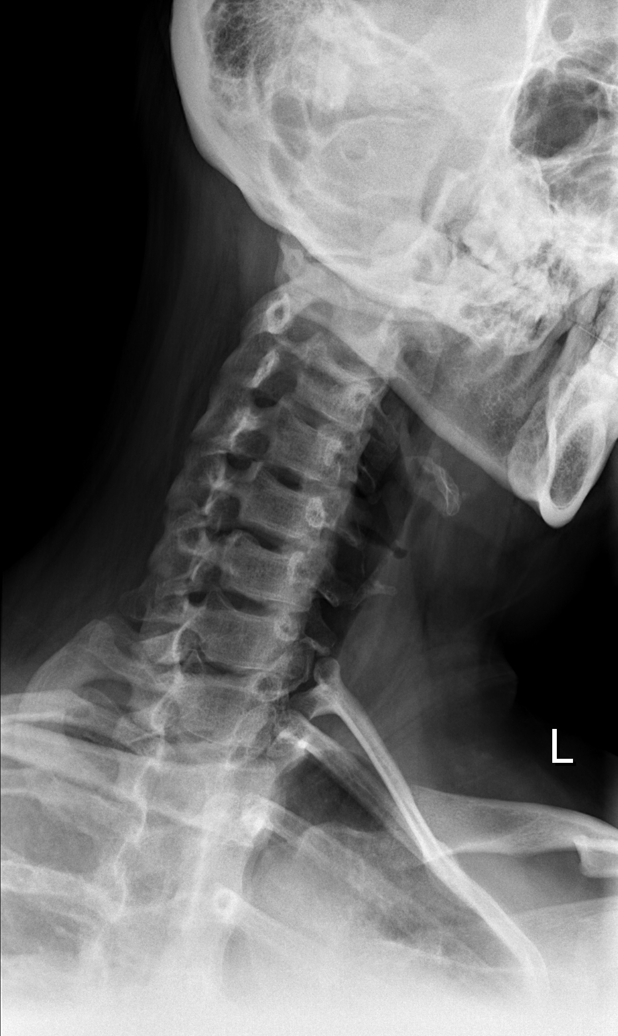

[w c-spine oblique (2 of 2)]
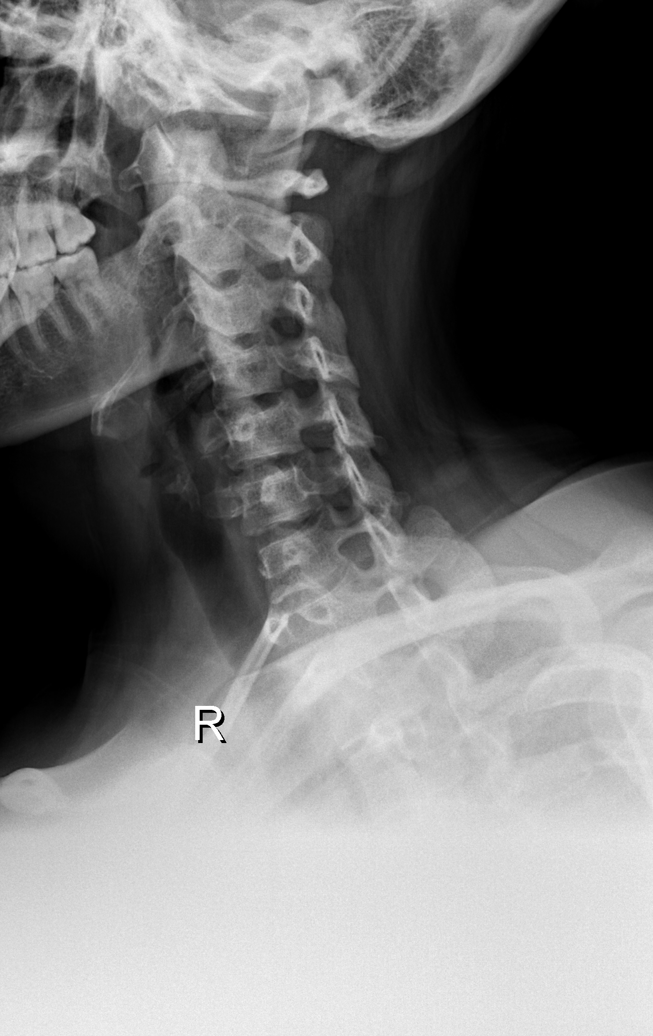

[w c-spine a.p.]
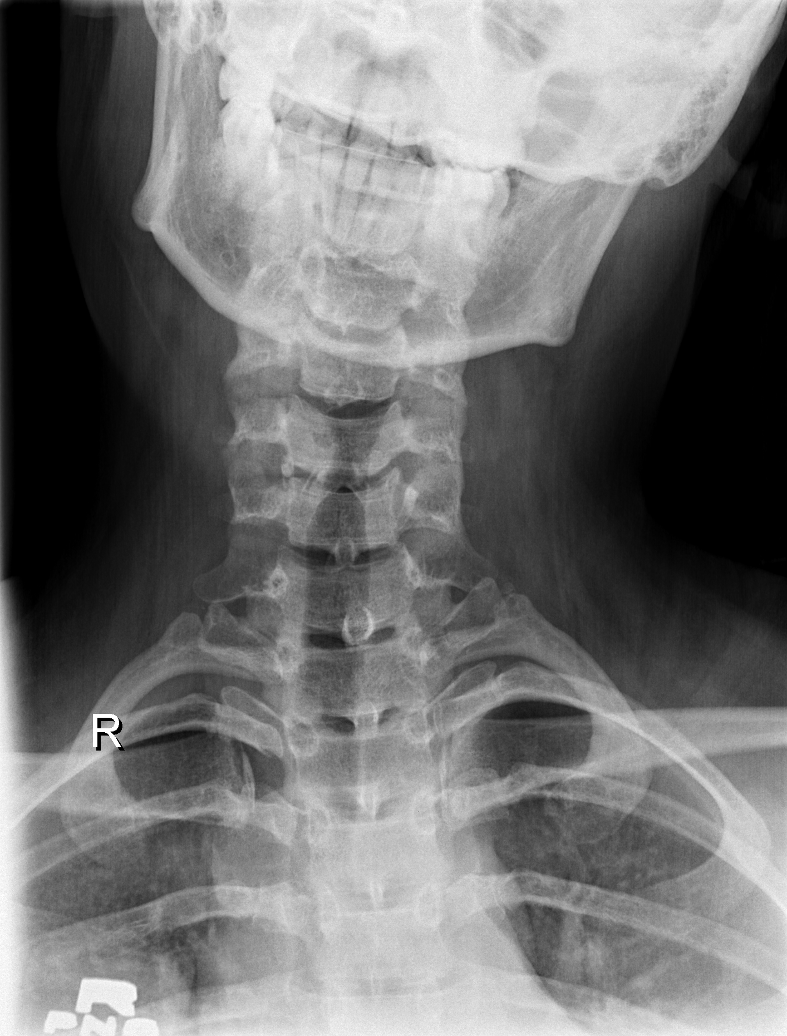

[5 of 5 positions shown; findings below may reference images not displayed]

FINDINGS: Seven cervical segments are well visualized. Vertebral body height
is well maintained. There is reversal of the normal cervical
lordosis likely related to muscular spasm. The neural foramina are
patent bilaterally. No acute fracture or acute facet abnormality is
noted. The head is somewhat tilted towards the left.
IMPRESSION: Changes consistent with muscular spasm. No acute bony abnormality is
noted.

## 2013-12-22 ENCOUNTER — Ambulatory Visit: Payer: Self-pay

## 2014-01-09 ENCOUNTER — Encounter (HOSPITAL_COMMUNITY): Payer: Self-pay | Admitting: Emergency Medicine

## 2014-01-09 ENCOUNTER — Emergency Department (HOSPITAL_COMMUNITY)
Admission: EM | Admit: 2014-01-09 | Discharge: 2014-01-09 | Disposition: A | Discharge status: Home / Self Care | Payer: Managed Care, Other (non HMO) | Attending: Emergency Medicine | Admitting: Emergency Medicine

## 2014-01-09 DIAGNOSIS — Y9289 Other specified places as the place of occurrence of the external cause: Secondary | ICD-10-CM | POA: Insufficient documentation

## 2014-01-09 DIAGNOSIS — J45909 Unspecified asthma, uncomplicated: Secondary | ICD-10-CM | POA: Insufficient documentation

## 2014-01-09 DIAGNOSIS — E119 Type 2 diabetes mellitus without complications: Secondary | ICD-10-CM | POA: Insufficient documentation

## 2014-01-09 DIAGNOSIS — E039 Hypothyroidism, unspecified: Secondary | ICD-10-CM | POA: Insufficient documentation

## 2014-01-09 DIAGNOSIS — T6391XA Toxic effect of contact with unspecified venomous animal, accidental (unintentional), initial encounter: Secondary | ICD-10-CM | POA: Insufficient documentation

## 2014-01-09 DIAGNOSIS — Z880 Allergy status to penicillin: Secondary | ICD-10-CM | POA: Insufficient documentation

## 2014-01-09 DIAGNOSIS — Z8669 Personal history of other diseases of the nervous system and sense organs: Secondary | ICD-10-CM | POA: Insufficient documentation

## 2014-01-09 DIAGNOSIS — T63461A Toxic effect of venom of wasps, accidental (unintentional), initial encounter: Secondary | ICD-10-CM | POA: Insufficient documentation

## 2014-01-09 DIAGNOSIS — F909 Attention-deficit hyperactivity disorder, unspecified type: Secondary | ICD-10-CM | POA: Insufficient documentation

## 2014-01-09 DIAGNOSIS — Z91040 Latex allergy status: Secondary | ICD-10-CM | POA: Insufficient documentation

## 2014-01-09 DIAGNOSIS — Z794 Long term (current) use of insulin: Secondary | ICD-10-CM | POA: Insufficient documentation

## 2014-01-09 DIAGNOSIS — IMO0002 Reserved for concepts with insufficient information to code with codable children: Secondary | ICD-10-CM | POA: Insufficient documentation

## 2014-01-09 DIAGNOSIS — T7840XA Allergy, unspecified, initial encounter: Secondary | ICD-10-CM

## 2014-01-09 DIAGNOSIS — Y93K1 Activity, walking an animal: Secondary | ICD-10-CM | POA: Insufficient documentation

## 2014-01-09 DIAGNOSIS — Z8679 Personal history of other diseases of the circulatory system: Secondary | ICD-10-CM | POA: Insufficient documentation

## 2014-01-09 DIAGNOSIS — Z79899 Other long term (current) drug therapy: Secondary | ICD-10-CM | POA: Insufficient documentation

## 2014-01-09 MED ORDER — PREDNISONE 20 MG PO TABS
40.0000 mg | ORAL_TABLET | Freq: Every day | ORAL | Status: AC
Start: 2014-01-09 — End: ?

## 2014-01-09 MED ORDER — RANITIDINE HCL 150 MG/10ML PO SYRP
150.0000 mg | ORAL_SOLUTION | ORAL | Status: AC
  Administered 2014-01-09: 150 mg via ORAL
  Filled 2014-01-09: qty 10

## 2014-01-09 MED ORDER — EPINEPHRINE 0.3 MG/0.3ML IJ SOAJ: 0.3000 mg | INTRAMUSCULAR | Status: AC | PRN

## 2014-01-09 NOTE — ED Notes (Signed)
Pt reports being stung by unknown insect approx 1 hour ago. Pt has hx of multiple allergic reactions which led to anaphylaxis and intubation. Pt took benadryl and solucortef 100mg  IM. Then had wheezing and sob, used her epi pen pta. No resp distress noted at this time.

## 2014-01-09 NOTE — ED Provider Notes (Signed)
CSN: [id number]     Arrival date & time 01/09/14  1046 History   First MD Initiated Contact with Patient 01/09/14 1115     Chief Complaint  Patient presents with  . Allergic Reaction      HPI Pt was seen at 1135. Per pt, c/o sudden onset and resolution of one episode of "allergic reaction" that occurred approx 1000 PTA. Pt states she was walking her dog when "something stung me" on the back of her right wrist. Pt states she has hx of significant environmental allergies, so she immediately took benadryl 50mg  PO and solucortef 100mg  IM when the "insect sting area" started to "itch." Pt states she continued to feel SOB with "wheezing" so she gave herself 1 dose of her epi pen. Pt states she "feels all better now." Denies any further symptoms since arrival to the ED. Denies CP/palpitations, no SOB/cough, no wheezing/stridor, no sore throat/dysphagia, no intra-oral edema, no drooling/hoarse voice.      Past Medical History  Diagnosis Date  . Diabetes mellitus   . Asthma   . Thyroid disease   . ADHD (attention deficit hyperactivity disorder)   . Hypothyroidism   . Tachycardia   . Dysautonomia   . Adrenal insufficiency    Past Surgical History  Procedure Laterality Date  . Tonsillectomy    . Adnoid    . Splenic cystectomy    . Splenectomy     Family History  Problem Relation Age of Onset  . Adopted: Yes   History  Substance Use Topics  . Smoking status: Never Smoker   . Smokeless tobacco: Not on file  . Alcohol Use: No    Review of Systems ROS: Statement: All systems negative except as marked or noted in the HPI; Constitutional: Negative for fever and chills. ; ; Eyes: Negative for eye pain, redness and discharge. ; ; ENMT: Negative for ear pain, hoarseness, nasal congestion, sinus pressure and sore throat. ; ; Cardiovascular: Negative for chest pain, palpitations, diaphoresis, and peripheral edema. ; ; Respiratory: +SOB, wheezing. Negative for cough, and stridor. ; ;  Gastrointestinal: Negative for nausea, vomiting, diarrhea, abdominal pain, blood in stool, hematemesis, jaundice and rectal bleeding. . ; ; Genitourinary: Negative for dysuria, flank pain and hematuria. ; ; Musculoskeletal: Negative for back pain and neck pain. Negative for swelling and trauma.; ; Skin: +itching rash. Negative for abrasions, blisters, bruising and skin lesion.; ; Neuro: Negative for headache, lightheadedness and neck stiffness. Negative for weakness, altered level of consciousness , altered mental status, extremity weakness, paresthesias, involuntary movement, seizure and syncope.      Allergies  Acetaminophen; Amoxicillin; Aspirin; Azithromycin; Barium iodide; Barium-containing compounds; Bee venom; Contrast media; Erythromycin; Fish allergy; Gelatin; Haemophilus influenza vaccines; Ibuprofen; Influenza vaccines; Iodine; Latex; Meningococcal vaccines; Nutritional supplements; Other; Oxycodone; Peanut-containing drug products; Penicillins; Rabies vaccines; Shellfish allergy; Sulfa antibiotics; Valium; Vegetable enzyme; Etodolac; Insulin glargine; and Insulins  Home Medications   Prior to Admission medications   Medication Sig Start Date End Date Taking? Authorizing Provider  albuterol (PROVENTIL HFA;VENTOLIN HFA) 108 (90 BASE) MCG/ACT inhaler Inhale 2 puffs into the lungs every 6 (six) hours as needed for wheezing or shortness of breath.     Historical Provider, MD  ALPRAZolam Prudy Feeler(XANAX) 0.5 MG tablet as needed. 08/31/13   Historical Provider, MD  APIDRA 100 UNIT/ML injection As directed 10/17/13   Historical Provider, MD  carvedilol (COREG) 3.125 MG tablet Take 3.125 mg by mouth 2 (two) times daily with a meal.    Historical Provider,  MD  diphenhydrAMINE (BENADRYL) 50 MG tablet Take 100 mg by mouth 2 (two) times daily.    Historical Provider, MD  EPINEPHrine (EPIPEN) 0.3 mg/0.3 mL SOAJ injection Inject 0.3 mg into the muscle daily as needed (Anaphylaxis).    Historical Provider, MD   famotidine (PEPCID) 20 MG tablet Take 20 mg by mouth 2 (two) times daily.     Historical Provider, MD  fexofenadine (ALLEGRA) 30 MG tablet Take 30 mg by mouth 2 (two) times daily.     Historical Provider, MD  Fluticasone-Salmeterol (ADVAIR) 250-50 MCG/DOSE AEPB Inhale 1 puff into the lungs every 12 (twelve) hours.     Historical Provider, MD  glucagon (GLUCAGON EMERGENCY) 1 MG injection Inject 0.25-1 mg into the vein once as needed. Low blood sugar    Historical Provider, MD  hydrocortisone (CORTEF) 20 MG tablet Take 20 mg by mouth 2 (two) times daily.    Historical Provider, MD  hydrocortisone sodium succinate (SOLU-CORTEF) 100 MG injection Inject 100 mg into the vein once as needed. For adrenal crisis     Historical Provider, MD  levothyroxine (SYNTHROID, LEVOTHROID) 150 MCG tablet Take 150 mcg by mouth daily.    Historical Provider, MD  loratadine (CLARITIN) 10 MG tablet Take 10 mg by mouth daily.     Historical Provider, MD  medroxyPROGESTERone (DEPO-PROVERA) 150 MG/ML injection Inject 150 mg into the muscle every 3 (three) months.    Historical Provider, MD  methylphenidate [facility](DAYTRANA) 30 MG/9HR Place 1 patch onto the skin daily. wear patch for 9 hours only each day    Historical Provider, MD  methylphenidate (RITALIN) 10 MG tablet Take 10 mg by mouth daily as needed. If patch wears off....for ADD    Historical Provider, MD  montelukast (SINGULAIR) 10 MG tablet Take 10 mg by mouth daily.     Historical Provider, MD  NOVOLOG 100 UNIT/ML injection As directed 10/24/13   Historical Provider, MD  olopatadine (PATANOL) 0.1 % ophthalmic solution Place 1 drop into both eyes 3 (three) times daily.     Historical Provider, MD  ondansetron (ZOFRAN-ODT) 4 MG disintegrating tablet 3 (three) times daily. 09/22/13   Historical Provider, MD   BP 163/104  Pulse 122  Temp(Src) 98 F (36.7 C) (Oral)  Resp 18  SpO2 100% Physical Exam 1140: Physical examination:  Nursing notes reviewed; Vital signs and O2  SAT reviewed;  Constitutional: Well developed, Well nourished, Well hydrated, In no acute distress; Head:  Normocephalic, atraumatic; Eyes: EOMI, PERRL, No scleral icterus; ENMT: TM's clear bilat. +edemetous nasal turbinates bilat with clear rhinorrhea. Mouth and pharynx without lesions. No tonsillar exudates. No intra-oral edema. No submandibular or sublingual edema. No hoarse voice, no drooling, no stridor. No trismus. Mouth and pharynx normal, Mucous membranes moist; Neck: Supple, Full range of motion, No lymphadenopathy; Cardiovascular: Regular rate and rhythm, No murmur, rub, or gallop; Respiratory: Breath sounds clear & equal bilaterally, No rales, rhonchi, wheezes.  Speaking full sentences with ease, Normal respiratory effort/excursion; Chest: Nontender, Movement normal; Abdomen: Soft, Nontender, Nondistended, Normal bowel sounds; Genitourinary: No CVA tenderness; Extremities: Pulses normal, No tenderness, No edema, No calf edema or asymmetry.; Neuro: AA&Ox3, Major CN grossly intact.  Speech clear. No gross focal motor or sensory deficits in extremities. Climbs on and off stretcher easily by herself. Gait steady.; Skin: Color normal, Warm, Dry. +small area of erythematous insect sting right proximal dorsal hand. No streaking, no ecchymosis, no open wounds. No generalized rash.     ED Course  Procedures  EKG Interpretation None      MDM  MDM Reviewed: previous chart, nursing note and vitals     1410:  Pt continues to "feel just fine" and wants to go home now. Per pt and EPIC chart review: pt is tachycardic at baseline. Pt has been ambulatory with steady gait, easy resps, NAD. Will continue to tx for allergic reaction. Pt has an insulin pump so she states she "can adjust my insulin for the prednisone."  Dx and testing d/w pt.  Questions answered.  Verb understanding, agreeable to d/c home with outpt f/u.       Laray Anger, DO 01/12/14 2356

## 2014-01-09 NOTE — ED Notes (Addendum)
Patient was at dog park with sister noticed pain, itching and erythema to right wrist. Patient took 50mg  benadryl PO. Pt sts went home begun wheezing and increased work of breathing. Pt took 125mg  Solu-cortef IM. Pt sts increased SOB, angioedema, difficulty forming sentences patient took 1 epi and was effective. Pt denies itching, pain, dyspnea. Patient airway is patent- lungs clear.

## 2014-01-09 NOTE — Discharge Instructions (Signed)
°Emergency Department Resource Guide °1) Find a Doctor and Pay Out of Pocket °Although you won't have to find out who is covered by your insurance plan, it is a good idea to ask around and get recommendations. You will then need to call the office and see if the doctor you have chosen will accept you as a new patient and what types of options they offer for patients who are self-pay. Some doctors offer discounts or will set up payment plans for their patients who do not have insurance, but you will need to ask so you aren't surprised when you get to your appointment. ° °2) Contact Your Local Health Department °Not all health departments have doctors that can see patients for sick visits, but many do, so it is worth a call to see if yours does. If you don't know where your local health department is, you can check in your phone book. The CDC also has a tool to help you locate your state's health department, and many state websites also have listings of all of their local health departments. ° °3) Find a Walk-in Clinic °If your illness is not likely to be very severe or complicated, you may want to try a walk in clinic. These are popping up all over the country in pharmacies, drugstores, and shopping centers. They're usually staffed by nurse practitioners or physician assistants that have been trained to treat common illnesses and complaints. They're usually fairly quick and inexpensive. However, if you have serious medical issues or chronic medical problems, these are probably not your best option. ° °No Primary Care Doctor: °- Call [facility] at  832-8000 - they can help you locate a primary care doctor that  accepts your insurance, provides certain services, etc. °- Physician Referral Service- 1-800-533-3463 ° °Chronic Pain Problems: °Organization         Address  Phone   Notes  °[facility]  (336) 297-2271 Patients need to be referred by their primary care doctor.  ° °Medication  Assistance: °Organization         Address  Phone   Notes  °[place] [facility] [address] °[place], [place] [address] (336) 641-8030 --Must be a resident of [place] °-- Must have NO insurance coverage whatsoever (no Medicaid/ Medicare, etc.) °-- The pt. MUST have a primary care doctor that directs their care regularly and follows them in the community °  °[facility]  (866) 331-1348   °[employer]  (888) 892-1162   ° °Agencies that provide inexpensive medical care: °Organization         Address  Phone   Notes  °[facility]  (336) 832-8035   °[facility]    (336) 832-7272   °[facility] [address] °[place], [place] [address] (336) 832-4777   °[facility] [address], °[place] (336) 271-4999   °[facility]    (336) 373-0678   °[facility]    (336) 272-1050   °[facility] ° [address], [place] Phone:  (336) 832-4444, Fax:  (336) 832-4440 Hours of Operation:  9 am - 6 pm, M-F.  Also accepts Medicaid/Medicare and self-pay.  °[facility] ° [address], [place] Phone: (336) 832-3150, Fax: (336) 832-3151. Hours of Operation:  8:30 am - 5:30 pm, M-F.  Also accepts Medicaid and self-pay.  °[facility] [place] [address], [place] Phone: (336) 878-6027   °[facility] [address], [place], [place] (336)723-1848, Ext. 123 Mondays & Thursdays: 7-9 AM.  First 15 patients are seen on a first come, first serve basis. °  ° °Medicaid-accepting [place] Providers: ° °Organization         Address  Phone   Notes  °[facility] [address], [place] (336) 641-2100 Also accepts self-pay patients.  °[facility] [address], [place] ° (336) 856-9996   °[facility] [address], [place]  (336) 288-8857   °[facility] [address] [place] [address], [place] (336) 299-7000   °Veita Bland [address], [place]  ° (336) 373-1557 Only accepts [place] Access Medicaid patients after they have their name applied to their card.  ° °Self-Pay (no insurance) in [place]: ° °Organization         Address  Phone   Notes  °Sickle Cell Patients, [facility] [address], [place] (336) 832-1970   °[facility] [address], [place] (336) 832-4400   °[facility] ° [address], [place] (336) 992-4800   °[facility]/Dr. Osei-Bonsu ° [address] [place] [address], [place] or [address], [place] (336) 841-8500 Phone number for both [place] and [place] locations is the same.  °[facility] [address], [place] (336) 299-0000   °[facility] [address] [place] [address], [place] or [address] (336) 852-7530 °(336) 878-2260   °[facility] [address], [place] (336) 350-1642, phone; (336) 294-5005, fax Sees patients 1st and 3rd Saturday of every month.  Must not qualify for public or private insurance (i.e. Medicaid, Medicare, [place] Health Choice, Veterans' Benefits) • Household income should be no more than 200% of the poverty level •The clinic cannot treat you if you are pregnant or think you are pregnant • Sexually transmitted diseases are not treated at the clinic.  ° ° °Dental Care: °Organization         Address  Phone  Notes  °[place] [facility] [address], [place] (336) 641-6152 Accepts children up to age 21 who are enrolled in Medicaid or [place] Health Choice; pregnant women with a Medicaid card; and children who have applied for Medicaid or [place] Health Choice, but were declined, whose parents can pay a reduced fee at time of service.  °[place]  [facility] [place]  [address], [place] (336) 641-7733 Accepts children up to age 21 who are enrolled in Medicaid or [place] Health Choice; pregnant women with a Medicaid card; and children who have applied for Medicaid or [place] Health Choice, but were declined, whose parents can pay a reduced fee at time of service.  °[facility] ° [address], [place] (336) 641-4533 Patients are seen by appointment only. Walk-ins are not accepted. [facility] will see patients 18 years of age and older. °Monday - Tuesday (8am-5pm) °Most Wednesdays (8:30-5pm) °$30 per visit, cash only  °[facility] ° [address], [place] (336) 641-4533 Patients are seen by appointment only. Walk-ins are not accepted. [facility] will see patients 18 years of age and older. °One   Wednesday Evening (Monthly: Volunteer Based).  $30 per visit, cash only  °[facility]  (919) 537-3737 for adults; Children under age 4, call Graduate Pediatric Dentistry at ([phone]) [phone]. Children aged 4-14, please call ([phone]) [phone] to request a pediatric application. ° Dental services are provided in all areas of dental care including fillings, crowns and bridges, complete and partial dentures, implants, gum treatment, root canals, and extractions. Preventive care is also provided. Treatment is provided to both adults and children. °Patients are selected via a lottery and there is often a waiting list. °  °[facility] [address], °[place] ° (336) 763-8833 [email or web address] °  °[facility] [address], [place], [place] (336)723-1848, Ext. 123 Second and Fourth Thursday of each month, opens at 6:30 AM; Clinic ends at 9 AM.  Patients are seen on a first-come first-served basis, and a limited number are seen during each clinic.  ° °[facility] ° [address], [place], [place] (336) 723-7904    Eligibility Requirements °You must have lived in [place], [place], or [place] counties for at least the last three months. °  You cannot be eligible for state or federal sponsored healthcare insurance, including Veterans Administration, Medicaid, or Medicare. °  You generally cannot be eligible for healthcare insurance through your employer.  °  How to apply: °Eligibility screenings are held every Tuesday and Wednesday afternoon from 1:00 pm until 4:00 pm. You do not need an appointment for the interview!  °[facility] [address], [place], [place] 336-631-2330   °[facility]  336-342-8273   °[facility]  336-703-3100   °[facility]  336-570-6415   ° °Behavioral Health Resources in the Community: °Intensive Outpatient Programs °Organization         Address  Phone  Notes  °[facility] [address], [place], [place] 336-878-6098   °[facility] [address], [place], [place] 336-832-9800   °[facility] [address], [place], [place] ° 336-882-2125   °[facility] [address],  °[place], [place] 1-800-853-5163 or 336-641-4981   °Substance Abuse Resources °Organization         Address  Phone  Notes  °[facility]  336-882-2125   °[facility]  336-784-9470   °[facility]  336-285-9073   °[facility]  336-845-3988   °Residential & Outpatient Substance Abuse Program  1-800-659-3381   °Psychological Services °Organization         Address  Phone  Notes  °[facility]  336- 832-9600   °[facility]  336- 378-7881   °[facility] [address], [place] 1-800-853-5163 or 336-641-4981   ° °Mobile Crisis Teams °Organization         Address  Phone  Notes  °[facility], Mobile Crisis Care Unit  1-877-626-1772   °Assertive °[facility] ° [address]  [place], [place] 336-834-9664   °Sharon DeEsch [address] °[place] 336-554-5454   ° °Self-Help/Support Groups °Organization         Address  Phone             Notes  °[facility]  - variety of support groups  336- 373-1402 Call for more information  °Narcotics Anonymous (NA), [facility] [address], °[place]  2 meetings at this location  ° °  Residential Treatment Programs Organization         Address  Phone  Notes  [facility] [address],    [place]  [phone]-(704)[phone]   [facility]  [address], [place] [phone], [place], [place] [phone]   [facility] [address], [place] [phone] Admissions: 8am-3pm M-F  [facility] [address]    [place], [place] 366-440-3474([phone]   [facility] [address], [place], [place] [phone]   [facility] [address]  [place], [place] [phone]   [facility] Intensive Outpatient [address], [place], [place] [phone]   [facility] [address]  [place], [place] [phone]-(701) 824-5414 or (289) [phone]   [facility] ([facility]) [address] [place], [place] 376-283-1517([phone])[phone] Accepts Medicaid  [facility] [address]  [place] [phone] Substance Abuse/Addiction Treatment   [facility] Organization         Address  Phone  Notes  [facility]  930-815-7216(888) 438-495-4022   Angie FavaJulie Brannon, PhD [address] [place], [place]   ([phone](336) 727-572-4943 or (914)[phone](336) (249) [phone]   [facility]   [address] [place], [place] [phone](336) [phone]   [facility] [address], [place], [place] [phone](336) ([phone] Insurance/Medicaid/sponsorship through [facility] [address]                                    [place], [place] [phone](336) [phone] Therapy/tele-psych/case    [facility] [address]   [place], [place] [phone](336) [phone]    Dr. Lolly MustacheArfeen  [phone]) [phone]   [facility]  [employer] [facility] 1) [address],  2) [address], [place] 3)  [address], [place] ([phone]) [phone](336) [phone](336) [phone]  [phone](336) [phone]   [facility] Child Abuse Hotline 3616205653(336) 319-686-8695 or [phone](336) 8065739078 (After Hours)       Take the prescriptions as directed.  Take over the counter benadryl, as directed on packaging, as needed for itching or rash.  If the benadryl is too sedating, take an over the counter non-sedating antihistamine such as claritin, allegra or zyrtec, as directed on packaging. The steroid medicine will likely increase your blood sugars. Call your regular medical doctor today to schedule a follow up appointment within the next 24 to 48 hours and to receive further guidance regarding your insulin dosing.  Return to the Emergency Department immediately sooner if worsening.

## 2014-01-09 NOTE — ED Notes (Signed)
Pharmacy tech at bedside 

## 2014-01-09 NOTE — ED Notes (Signed)
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 Patient agreeable to staying for monitoring.

## 2014-01-11 ENCOUNTER — Emergency Department: Payer: Self-pay | Admitting: Emergency Medicine

## 2016-11-04 ENCOUNTER — Emergency Department: Payer: Managed Care, Other (non HMO)

## 2016-11-04 ENCOUNTER — Encounter: Payer: Self-pay | Admitting: Emergency Medicine

## 2016-11-04 ENCOUNTER — Emergency Department
Admission: EM | Admit: 2016-11-04 | Discharge: 2016-11-04 | Disposition: A | Discharge status: Home / Self Care | Payer: Managed Care, Other (non HMO) | Attending: Student in an Organized Health Care Education/Training Program | Admitting: Student in an Organized Health Care Education/Training Program

## 2016-11-04 DIAGNOSIS — M791 Myalgia, unspecified site: Secondary | ICD-10-CM

## 2016-11-04 DIAGNOSIS — E109 Type 1 diabetes mellitus without complications: Secondary | ICD-10-CM | POA: Diagnosis not present

## 2016-11-04 DIAGNOSIS — Z5181 Encounter for therapeutic drug level monitoring: Secondary | ICD-10-CM | POA: Diagnosis not present

## 2016-11-04 DIAGNOSIS — R51 Headache: Secondary | ICD-10-CM | POA: Insufficient documentation

## 2016-11-04 DIAGNOSIS — R072 Precordial pain: Secondary | ICD-10-CM | POA: Insufficient documentation

## 2016-11-04 DIAGNOSIS — F909 Attention-deficit hyperactivity disorder, unspecified type: Secondary | ICD-10-CM | POA: Insufficient documentation

## 2016-11-04 DIAGNOSIS — E039 Hypothyroidism, unspecified: Secondary | ICD-10-CM | POA: Diagnosis not present

## 2016-11-04 DIAGNOSIS — J45909 Unspecified asthma, uncomplicated: Secondary | ICD-10-CM | POA: Diagnosis not present

## 2016-11-04 DIAGNOSIS — Z79899 Other long term (current) drug therapy: Secondary | ICD-10-CM | POA: Diagnosis not present

## 2016-11-04 DIAGNOSIS — R079 Chest pain, unspecified: Secondary | ICD-10-CM

## 2016-11-04 DIAGNOSIS — R519 Headache, unspecified: Secondary | ICD-10-CM

## 2016-11-04 HISTORY — DX: Iron deficiency: E61.1

## 2016-11-04 LAB — APTT: aPTT: 33 seconds (ref 24–36)

## 2016-11-04 LAB — COMPREHENSIVE METABOLIC PANEL
ALK PHOS: 69 U/L (ref 38–126)
ALT: 41 U/L (ref 14–54)
ANION GAP: 9 (ref 5–15)
AST: 26 U/L (ref 15–41)
Albumin: 3.7 g/dL (ref 3.5–5.0)
BUN: 10 mg/dL (ref 6–20)
CALCIUM: 9.2 mg/dL (ref 8.9–10.3)
CO2: 22 mmol/L (ref 22–32)
Chloride: 106 mmol/L (ref 101–111)
Creatinine, Ser: 0.57 mg/dL (ref 0.44–1.00)
GFR calc Af Amer: >60 mL/min (ref >60)
GFR calc non Af Amer: >60 mL/min (ref >60)
Glucose, Bld: 98 mg/dL (ref 65–99)
Potassium: 3.4 mmol/L — ABNORMAL LOW (ref 3.5–5.1)
SODIUM: 137 mmol/L (ref 135–145)
TOTAL PROTEIN: 8.7 g/dL — AB (ref 6.5–8.1)
Total Bilirubin: 0.5 mg/dL (ref 0.3–1.2)

## 2016-11-04 LAB — CBC
HCT: 34.3 % — ABNORMAL LOW (ref 35.0–47.0)
HEMOGLOBIN: 11.4 g/dL — AB (ref 12.0–16.0)
MCH: 24.4 pg — AB (ref 26.0–34.0)
MCHC: 33.2 g/dL (ref 32.0–36.0)
MCV: 73.3 fL — ABNORMAL LOW (ref 80.0–100.0)
Platelets: 744 10*3/uL — ABNORMAL HIGH (ref 150–440)
RBC: 4.67 MIL/uL (ref 3.80–5.20)
RDW: 18.9 % — ABNORMAL HIGH (ref 11.5–14.5)
WBC: 17.8 10*3/uL — ABNORMAL HIGH (ref 3.6–11.0)

## 2016-11-04 LAB — FIBRIN DERIVATIVES D-DIMER (ARMC ONLY): FIBRIN DERIVATIVES D-DIMER (ARMC): 375.49 (ref 0.00–499.00)

## 2016-11-04 LAB — PROTIME-INR
INR: 1.16
PROTHROMBIN TIME: 14.9 s (ref 11.4–15.2)

## 2016-11-04 NOTE — ED Triage Notes (Addendum)
Says started having side effects from med yesterday an hour [facility] with chest and abd pain and all over pain.  Her docs told her to take took hydrocortisone and im solucortef.  This am she was not better.  Received iron and procrit.

## 2016-11-04 NOTE — ED Notes (Signed)
Pt alert.  No acute distress   Pt waiting on lab results

## 2016-11-04 NOTE — ED Notes (Signed)
Pt reports she had a iron infusion yesterday.  sple[facility] 2013 at [facility].  Today pt has pain all over.  Pt concerned over platelet count.  Pt gave self im injection solucortef and hydrocortosone last night per [facility].  Pt alert.  Speech clear.

## 2016-11-04 NOTE — ED Provider Notes (Signed)
[facility] Emergency Department Provider Note    First MD Initiated Contact with Patient 11/04/16 [phone]     (approximate)  I have reviewed the triage vital signs and the nursing notes.   HISTORY  Chief Complaint Allergic Reaction    HPI Victoria Sosa is a 26 y.o. female who presents with myalgias midsternal chest pain abdominal pain and headache one day after receiving Procrit and iron infusions. Patient with a history of splenectomy. States that she's been talking to her concierge physician over the past day and was supposed to be started on prednisone . Concern for adverse reaction. Symptoms lasted throughout the past 24 hours and she was directed to come to the ER for further evaluation due to concern for prolonged symptoms.   Past Medical History:  Diagnosis Date  . ADHD (attention deficit hyperactivity disorder)   . Adrenal insufficiency (HCC)   . Asthma   . Diabetes mellitus   . Dysautonomia   . Hypothyroidism   . Iron deficiency   . Tachycardia   . Thyroid disease    Family History  Problem Relation Age of Onset  . Adopted: Yes   Past Surgical History:  Procedure Laterality Date  . adnoid    . SPLENECTOMY    . Splenic cystectomy    . TONSILLECTOMY     Patient Active Problem List   Diagnosis Date Noted  . Anaphylactic reaction 12/17/2012  . DM type 1 (diabetes mellitus, type 1) (HCC) 12/17/2012  . Hypothyroidism 12/17/2012  . Asthma 12/17/2012  . ADHD (attention deficit hyperactivity disorder) 12/17/2012  . Adrenal insufficiency (HCC) 12/17/2012      Prior to Admission medications   Medication Sig Start Date End Date Taking? Authorizing Provider  albuterol (PROVENTIL HFA;VENTOLIN HFA) 108 (90 BASE) MCG/ACT inhaler Inhale 2 puffs into the lungs every 6 (six) hours as needed for wheezing or shortness of breath.     Historical Provider, MD  APIDRA 100 UNIT/ML injection Inject into the skin as needed. Only uses if pump fails  10/17/13   Historical Provider, MD  carvedilol (COREG) 3.125 MG tablet Take 3.125 mg by mouth 2 (two) times daily with a meal.    Historical Provider, MD  diphenhydrAMINE (BENADRYL) 50 MG tablet Take 100 mg by mouth 2 (two) times daily.    Historical Provider, MD  EPINEPHrine (EPIPEN) 0.3 mg/0.3 mL IJ SOAJ injection Inject 0.3 mLs (0.3 mg total) into the muscle as needed (for allergic reaction). 01/09/14   Samuel Jester, DO  EPINEPHrine (EPIPEN) 0.3 mg/0.3 mL SOAJ injection Inject 0.3 mg into the muscle daily as needed (Anaphylaxis).    Historical Provider, MD  famotidine (PEPCID) 20 MG tablet Take 20 mg by mouth 2 (two) times daily.     Historical Provider, MD  fexofenadine (ALLEGRA) 30 MG tablet Take 30 mg by mouth 2 (two) times daily.     Historical Provider, MD  Fluticasone-Salmeterol (ADVAIR) 250-50 MCG/DOSE AEPB Inhale 1 puff into the lungs every 12 (twelve) hours.     Historical Provider, MD  glucagon (GLUCAGON EMERGENCY) 1 MG injection Inject 0.25-1 mg into the vein once as needed. Low blood sugar    Historical Provider, MD  hydrocortisone (CORTEF) 20 MG tablet Take 20 mg by mouth 2 (two) times daily.    Historical Provider, MD  hydrocortisone sodium succinate (SOLU-CORTEF) 100 MG injection Inject 100 mg into the vein once as needed. For adrenal crisis     Historical Provider, MD  Insulin Human (INSULIN PUMP) SOLN  Inject into the skin. Novolog insulin    Historical Provider, MD  levothyroxine (SYNTHROID, LEVOTHROID) 150 MCG tablet Take 150 mcg by mouth daily.    Historical Provider, MD  loratadine (CLARITIN) 10 MG tablet Take 10 mg by mouth daily.     Historical Provider, MD  medroxyPROGESTERone (DEPO-PROVERA) 150 MG/ML injection Inject 150 mg into the muscle every 3 (three) months.    Historical Provider, MD  methylphenidate [facility]) 30 MG/9HR Place 1 patch onto the skin daily. wear patch for 9 hours only each day    Historical Provider, MD  methylphenidate (RITALIN) 10 MG tablet Take 10  mg by mouth daily as needed. If patch wears off....for ADD    Historical Provider, MD  montelukast (SINGULAIR) 10 MG tablet Take 10 mg by mouth daily.     Historical Provider, MD  olopatadine (PATANOL) 0.1 % ophthalmic solution Place 1 drop into both eyes 3 (three) times daily.     Historical Provider, MD  ondansetron (ZOFRAN-ODT) 4 MG disintegrating tablet Take 4 mg by mouth 3 (three) times daily.  09/22/13   Historical Provider, MD  predniSONE (DELTASONE) 20 MG tablet Take 2 tablets (40 mg total) by mouth daily. 01/09/14   Samuel Jester, DO    Allergies Acetaminophen; Amoxicillin; Aspirin; Azithromycin; Barium iodide; Barium-containing compounds; Bee venom; Contrast media [iodinated diagnostic agents]; Erythromycin; Fish allergy; Gelatin; Haemophilus influenza vaccines; Ibuprofen; Influenza vaccines; Insulins; Iodine; Labetalol; Latex; Meningococcal vaccines; Nutritional supplements; Other; Oxycodone; Peanut-containing drug products; Penicillins; Pneumococcal vac polyvalent; Rabies vaccines; Shellfish allergy; Sulfa antibiotics; Valium [diazepam]; Vegetable enzyme; Etodolac; and Insulin glargine    Social History Social History  Substance Use Topics  . Smoking status: Never Smoker  . Smokeless tobacco: Never Used  . Alcohol use No    Review of Systems Patient denies headaches, rhinorrhea, blurry vision, numbness, shortness of breath, chest pain, edema, cough, abdominal pain, nausea, vomiting, diarrhea, dysuria, fevers, rashes or hallucinations unless otherwise stated above in HPI. ____________________________________________   PHYSICAL EXAM:  VITAL SIGNS: Vitals:   11/04/16 1352  BP: (!) 120/94  Pulse: (!) 118  Temp: 98.6 F (37 C)    Constitutional: Alert and oriented. Well appearing and in no acute distress. Eyes: Conjunctivae are normal. PERRL. EOMI. Head: Atraumatic. Nose: No congestion/rhinnorhea. Mouth/Throat: Mucous membranes are moist.  Oropharynx  non-erythematous. Neck: No stridor. Painless ROM. No cervical spine tenderness to palpation Hematological/Lymphatic/Immunilogical: No cervical lymphadenopathy. Cardiovascular: Normal rate, regular rhythm. Grossly normal heart sounds.  Good peripheral circulation. Respiratory: Normal respiratory effort.  No retractions. Lungs CTAB. Gastrointestinal: Soft and nontender. No distention. No abdominal bruits. No CVA tenderness. Genitourinary:  Musculoskeletal: No lower extremity tenderness nor edema.  No joint effusions. Neurologic:  Normal speech and language. No gross focal neurologic deficits are appreciated. No gait instability. Skin:  Skin is warm, dry and intact. No rash noted. Psychiatric: Mood and affect are normal. Speech and behavior are normal.  ____________________________________________   LABS (all labs ordered are listed, but only abnormal results are displayed)  Results for orders placed or performed during the hospital encounter of 11/04/16 (from the past 24 hour(s))  CBC     Status: Abnormal   Collection Time: 11/04/16  2:15 PM  Result Value Ref Range   WBC 17.8 (H) 3.6 - 11.0 K/uL   RBC 4.67 3.80 - 5.20 MIL/uL   Hemoglobin 11.4 (L) 12.0 - 16.0 g/dL   HCT 16.1 (L) 09.6 - 04.5 %   MCV 73.3 (L) 80.0 - 100.0 fL   MCH 24.4 (L)  26.0 - 34.0 pg   MCHC 33.2 32.0 - 36.0 g/dL   RDW 16.118.9 (H) 09.611.5 - 04.514.5 %   Platelets 744 (H) 150 - 440 K/uL  Comprehensive metabolic panel     Status: Abnormal   Collection Time: 11/04/16  2:15 PM  Result Value Ref Range   Sodium 137 135 - 145 mmol/L   Potassium 3.4 (L) 3.5 - 5.1 mmol/L   Chloride 106 101 - 111 mmol/L   CO2 22 22 - 32 mmol/L   Glucose, Bld 98 65 - 99 mg/dL   BUN 10 6 - 20 mg/dL   Creatinine, Ser 4.090.57 0.44 - 1.00 mg/dL   Calcium 9.2 8.9 - 81.110.3 mg/dL   Total Protein 8.7 (H) 6.5 - 8.1 g/dL   Albumin 3.7 3.5 - 5.0 g/dL   AST 26 15 - 41 U/L   ALT 41 14 - 54 U/L   Alkaline Phosphatase 69 38 - 126 U/L   Total Bilirubin 0.5 0.3 -  1.2 mg/dL   GFR calc non Af Amer >60 >60 mL/min   GFR calc Af Amer >60 >60 mL/min   Anion gap 9 5 - 15  Protime-INR     Status: None   Collection Time: 11/04/16  2:15 PM  Result Value Ref Range   Prothrombin Time 14.9 11.4 - 15.2 seconds   INR 1.16   APTT     Status: None   Collection Time: 11/04/16  2:15 PM  Result Value Ref Range   aPTT 33 24 - 36 seconds   ____________________________________________  EKG My review and personal interpretation at Time: 17:01   Indication: tachycardia  Rate: 110  Rhythm: sinus Axis: normal Other: non specific st changes, no acute elevations ____________________________________________  RADIOLOGY  ____________________________________________   PROCEDURES  Procedure(s) performed:  Procedures    Critical Care performed: no ____________________________________________   INITIAL IMPRESSION / ASSESSMENT AND PLAN / ED COURSE  Pertinent labs & imaging results that were available during my care of the patient were reviewed by me and considered in my medical decision making (see chart for details).  DDX: allergic reaction, myalgias, dysrhythmia, pe  Victoria Sosa is a 26 y.o. who presents to the ED with symptoms as described above. She is afebrile but tachycardic. Patient very pleasant and well-appearing. Does not appear to be in any acute distress. Not consistent with anaphylactic reaction. My concern however is for possible PTE as she is on multiple medications increasing her risk she has thrombocytosis and is on Depo-Provera. Patient is allergic to multiple medications. Will further risk stratify with a d-dimer looking for pulmonary embolism DVT.  Clinical Course as of Nov 05 2226  Wed Nov 04, 2016  1829 D-dimer negative.  PAtient in no acute distress.  Do feel patient stable for close follow up as an outpatient.   [PR]    Clinical Course User Index [PR] Willy EddyPatrick Brindy Higginbotham, MD      ____________________________________________   FINAL CLINICAL IMPRESSION(S) / ED DIAGNOSES  Final diagnoses:  Chest pain, unspecified type  Myalgia  Nonintractable headache, unspecified chronicity pattern, unspecified headache type      NEW MEDICATIONS STARTED DURING THIS VISIT:  New Prescriptions   No medications on file     Note:  This document was prepared using Dragon voice recognition software and may include unintentional dictation errors.    Willy EddyPatrick Amahd Morino, MD 11/04/16 2228

## 2016-11-04 NOTE — Discharge Instructions (Signed)
Please follow up with PCP.  Return immediately for worsening symptoms, concerns or questions.  Go immediately to pharmacy to pick up your steroid prescription.
